# Patient Record
Sex: Female | Born: 1960
Health system: Southern US, Community
[De-identification: ages and names within clinical notes are randomized; demographics above are authoritative.]

## PROBLEM LIST (undated history)

## (undated) DIAGNOSIS — F419 Anxiety disorder, unspecified: Secondary | ICD-10-CM

## (undated) DIAGNOSIS — J45909 Unspecified asthma, uncomplicated: Secondary | ICD-10-CM

## (undated) DIAGNOSIS — J309 Allergic rhinitis, unspecified: Secondary | ICD-10-CM

## (undated) DIAGNOSIS — K589 Irritable bowel syndrome without diarrhea: Secondary | ICD-10-CM

## (undated) DIAGNOSIS — C801 Malignant (primary) neoplasm, unspecified: Secondary | ICD-10-CM

## (undated) DIAGNOSIS — I10 Essential (primary) hypertension: Secondary | ICD-10-CM

## (undated) DIAGNOSIS — T7840XA Allergy, unspecified, initial encounter: Secondary | ICD-10-CM

## (undated) DIAGNOSIS — E785 Hyperlipidemia, unspecified: Secondary | ICD-10-CM

## (undated) DIAGNOSIS — K5289 Other specified noninfective gastroenteritis and colitis: Secondary | ICD-10-CM

## (undated) DIAGNOSIS — M1990 Unspecified osteoarthritis, unspecified site: Secondary | ICD-10-CM

## (undated) HISTORY — PX: BREAST REDUCTION SURGERY: SHX8

## (undated) HISTORY — DX: Allergic rhinitis, unspecified: J30.9

## (undated) HISTORY — DX: Unspecified asthma, uncomplicated: J45.909

## (undated) HISTORY — PX: OTHER SURGICAL HISTORY: SHX169

## (undated) HISTORY — DX: Malignant (primary) neoplasm, unspecified: C80.1

## (undated) HISTORY — PX: WISDOM TOOTH EXTRACTION: SHX21

## (undated) HISTORY — DX: Allergy, unspecified, initial encounter: T78.40XA

## (undated) HISTORY — DX: Hyperlipidemia, unspecified: E78.5

## (undated) HISTORY — DX: Irritable bowel syndrome, unspecified: K58.9

## (undated) HISTORY — DX: Unspecified osteoarthritis, unspecified site: M19.90

## (undated) HISTORY — DX: Essential (primary) hypertension: I10

## (undated) HISTORY — PX: COLONOSCOPY: SHX174

## (undated) HISTORY — DX: Other specified noninfective gastroenteritis and colitis: K52.89

## (undated) HISTORY — DX: Anxiety disorder, unspecified: F41.9

## (undated) HISTORY — PX: KNEE ARTHROSCOPY: SHX127

---

## 2000-04-15 ENCOUNTER — Other Ambulatory Visit: Admission: RE | Admit: 2000-04-15 | Discharge: 2000-04-15 | Payer: Self-pay | Admitting: Obstetrics & Gynecology

## 2001-02-14 ENCOUNTER — Encounter: Payer: Self-pay | Admitting: Obstetrics and Gynecology

## 2001-02-14 ENCOUNTER — Ambulatory Visit (HOSPITAL_COMMUNITY): Admission: RE | Admit: 2001-02-14 | Discharge: 2001-02-14 | Payer: Self-pay | Admitting: Obstetrics and Gynecology

## 2001-03-11 ENCOUNTER — Other Ambulatory Visit: Admission: RE | Admit: 2001-03-11 | Discharge: 2001-03-11 | Payer: Self-pay | Admitting: Obstetrics and Gynecology

## 2001-03-11 ENCOUNTER — Encounter (INDEPENDENT_AMBULATORY_CARE_PROVIDER_SITE_OTHER): Payer: Self-pay | Admitting: *Deleted

## 2001-05-02 ENCOUNTER — Other Ambulatory Visit: Admission: RE | Admit: 2001-05-02 | Discharge: 2001-05-02 | Payer: Self-pay | Admitting: Obstetrics and Gynecology

## 2001-12-08 ENCOUNTER — Encounter: Payer: Self-pay | Admitting: Obstetrics and Gynecology

## 2001-12-08 ENCOUNTER — Ambulatory Visit (HOSPITAL_COMMUNITY): Admission: RE | Admit: 2001-12-08 | Discharge: 2001-12-08 | Payer: Self-pay | Admitting: Obstetrics and Gynecology

## 2002-05-04 ENCOUNTER — Other Ambulatory Visit: Admission: RE | Admit: 2002-05-04 | Discharge: 2002-05-04 | Payer: Self-pay | Admitting: Obstetrics and Gynecology

## 2002-09-14 HISTORY — PX: BUNIONECTOMY: SHX129

## 2002-09-14 HISTORY — PX: TOTAL ABDOMINAL HYSTERECTOMY: SHX209

## 2003-01-01 ENCOUNTER — Encounter (INDEPENDENT_AMBULATORY_CARE_PROVIDER_SITE_OTHER): Payer: Self-pay | Admitting: Specialist

## 2003-01-01 ENCOUNTER — Observation Stay (HOSPITAL_COMMUNITY): Admission: RE | Admit: 2003-01-01 | Discharge: 2003-01-02 | Payer: Self-pay | Admitting: Obstetrics and Gynecology

## 2003-05-04 ENCOUNTER — Ambulatory Visit (HOSPITAL_COMMUNITY): Admission: RE | Admit: 2003-05-04 | Discharge: 2003-05-04 | Payer: Self-pay | Admitting: Obstetrics and Gynecology

## 2003-05-04 ENCOUNTER — Encounter: Payer: Self-pay | Admitting: Obstetrics and Gynecology

## 2003-06-18 ENCOUNTER — Other Ambulatory Visit: Admission: RE | Admit: 2003-06-18 | Discharge: 2003-06-18 | Payer: Self-pay | Admitting: Obstetrics and Gynecology

## 2004-08-26 ENCOUNTER — Other Ambulatory Visit: Admission: RE | Admit: 2004-08-26 | Discharge: 2004-08-26 | Payer: Self-pay | Admitting: Obstetrics and Gynecology

## 2005-07-31 ENCOUNTER — Ambulatory Visit: Payer: Self-pay | Admitting: Internal Medicine

## 2005-09-02 ENCOUNTER — Other Ambulatory Visit: Admission: RE | Admit: 2005-09-02 | Discharge: 2005-09-02 | Payer: Self-pay | Admitting: Obstetrics and Gynecology

## 2006-07-29 ENCOUNTER — Ambulatory Visit: Payer: Self-pay | Admitting: Internal Medicine

## 2006-10-25 ENCOUNTER — Ambulatory Visit: Payer: Self-pay | Admitting: Internal Medicine

## 2007-08-16 ENCOUNTER — Telehealth (INDEPENDENT_AMBULATORY_CARE_PROVIDER_SITE_OTHER): Payer: Self-pay | Admitting: *Deleted

## 2007-08-19 ENCOUNTER — Ambulatory Visit: Payer: Self-pay | Admitting: Internal Medicine

## 2008-04-26 ENCOUNTER — Ambulatory Visit: Payer: Self-pay | Admitting: Internal Medicine

## 2008-04-26 DIAGNOSIS — J45901 Unspecified asthma with (acute) exacerbation: Secondary | ICD-10-CM | POA: Insufficient documentation

## 2008-04-26 DIAGNOSIS — J302 Other seasonal allergic rhinitis: Secondary | ICD-10-CM | POA: Insufficient documentation

## 2008-04-26 DIAGNOSIS — H6690 Otitis media, unspecified, unspecified ear: Secondary | ICD-10-CM | POA: Insufficient documentation

## 2008-04-26 DIAGNOSIS — K5289 Other specified noninfective gastroenteritis and colitis: Secondary | ICD-10-CM | POA: Insufficient documentation

## 2008-04-30 ENCOUNTER — Telehealth (INDEPENDENT_AMBULATORY_CARE_PROVIDER_SITE_OTHER): Payer: Self-pay | Admitting: *Deleted

## 2008-06-21 ENCOUNTER — Ambulatory Visit: Payer: Self-pay | Admitting: Internal Medicine

## 2009-07-23 ENCOUNTER — Ambulatory Visit: Payer: Self-pay | Admitting: Internal Medicine

## 2009-09-23 ENCOUNTER — Telehealth: Payer: Self-pay | Admitting: Internal Medicine

## 2010-10-14 NOTE — Progress Notes (Signed)
Summary: prescript  Phone Note Call from Patient Call back at [phone]   Caller: Patient Call For: Tyshana Nishida Summary of Call: need prescript for generic [facility]  pharmacy [phone] [facility] Initial call taken by: Rickard Patience,  September 23, 2009 8:37 AM  Follow-up for Phone Call        rx sent. pt aware. Carron Curie CMA  September 23, 2009 8:41 AM     Prescriptions: Aleda Grana 50 MCG/ACT  SUSP (FLUTICASONE PROPIONATE) 2 sprays each nostril once daily as needed  #1 x 3   Entered by:   Carron Curie CMA   Authorized by:   Waymon Budge MD   Signed by:   Carron Curie CMA on 09/23/2009   Method used:   Electronically to        [facility] (retail)       [address]       [place], [place]  [address]       Ph: [phone]       Fax: [phone]   RxID:   [id number]

## 2011-01-27 NOTE — Assessment & Plan Note (Signed)
 HEALTHCARE                             PULMONARY OFFICE NOTE   NAME:COLLINS, JENNFER GASSEN                    MRN:          [id number]  DATE:08/19/2007                            DOB:          December 13, 1960    PROBLEMS:  1. Allergic asthma.  2. Allergic rhinitis.  3. History of colitis.   HISTORY:  First visit in almost a year. She had an episode of nasal and  chest congestion with yellow mucus and is now getting better on a  standby prescription for doxycycline that she has started. She feels  that she is getting better. She has had an occasional exposure to a cat.  It is not clear that it is significant. She did get flu shot.   MEDICATIONS:  1. Advair 100/50.  2. Flonase 2 sprays each nostril daily.  3. Paxil 15 mg.  4. Zyrtec 10 mg.  5. Progesterone cream.  6. Pro Air HFA inhaler.  7. Albuterol nebulizer available if needed.   Drug intolerant CODEINE.   OBJECTIVE:  Weight 168 pounds, blood pressure 124/80, pulse 75, room air  saturation 98%. Clear chest. Normal heart sounds. Throat looks clear. I  do not see any nasal congestion. I do not find adenopathy.   IMPRESSION:  Recent URI with bronchitis responding to doxycycline.  Background problems of asthma and allergic rhinitis otherwise have done  well.   PLAN:  1. Finish doxycycline, fluids, and supportive care.  2. We refilled Advair, Pro Air, and Flonase with discussion.  3. Schedule return one year, earlier p.r.n.     Clinton D. Maple Hudson, MD, Tonny Bollman, FACP  Electronically Signed    CDY/MedQ  DD: 08/20/2007  DT: 08/21/2007  Job #: [id number]   cc:   Harrel Lemon. Merla Riches, M.D.

## 2011-01-29 ENCOUNTER — Ambulatory Visit: Payer: Self-pay | Admitting: Internal Medicine

## 2011-01-30 NOTE — Discharge Summary (Signed)
NAME:  [name] [name], [name] [name]                       ACCOUNT NO.:  [id number]   MEDICAL RECORD NO.:  [id number]                   PATIENT TYPE:  OBV   LOCATION:  9115                                 [facility]ILITY:  [facility]   PHYSICIAN:  Janine Limbo, M.D.            DATE OF BIRTH:  [birth year]   DATE OF ADMISSION:  01/01/2003  DATE OF DISCHARGE:  01/02/2003                                 DISCHARGE SUMMARY   DISCHARGE DIAGNOSES:  1. Fibroid uterus.  2. Menorrhagia.  3. Pelvic pressure.   OPERATION:  On the day of admission patient underwent a total vaginal  hysterectomy.  Tolerated procedure well.  The patient was found to have a 14-  week sized uterus with normal appearing tubes and ovaries.   HISTORY OF PRESENT ILLNESS:  The patient is a 50 year old female para 0-0-1-  0 who presents for vaginal hysterectomy because of fibroids, menorrhagia,  and pelvic pressure.  Please see patient's dictated history and physical  examination for details.   PHYSICAL EXAMINATION:  VITAL SIGNS:  Weight 148 pounds.  GENERAL:  Within normal limits.  PELVIC:  External genitalia within normal limits.  Vagina is within normal  limits.  Cervix is nontender.  Uterus is 12 weeks size, irregular, and firm.  Adnexa without masses.  Rectovaginal examination confirms.   HOSPITAL COURSE:  On date of admission patient underwent aforementioned  procedure, tolerating it well.  Postoperative course was unremarkable with  patient resuming bowel and bladder function by postoperative day one and  therefore deemed ready for discharge home.  The patient's postoperative  hemoglobin was 10.7 (preoperative hemoglobin 12.7).   DISCHARGE MEDICATIONS:  1. Percocet one to two tablets q.4-6h. as needed for pain.  2. Ibuprofen 600 mg one tablet q.6h. with food for five days, then p.r.n.     for pain.  3. Iron one tablet b.i.d. for six weeks.  4. Phenergan 12.5 mg one tablet q.6h. as needed for nausea.  5. Colace 100 mg one  tablet b.i.d. until bowel movements are regular.  6. The patient was advised to resume her pre hospital medications with the     exception of Lo-Estrin.   FOLLOW UP:  The patient is scheduled for six week postoperative visit with  Janine Limbo, M.D. on February 13, 2003 at 3:00 p.m.    DISCHARGE INSTRUCTIONS:  The patient was given a copy of [facility] postoperative instruction sheet.  She was further advised to avoid  driving for two weeks, heavy lifting for four weeks, and intercourse for six  weeks.  The patient's diet was without restrictions.  Final pathology was  not available at time of patient's discharge.     [name] [name].                    Janine Limbo, M.D.    EJP/MEDQ  D:  01/02/2003  T:  01/02/2003  Job:  [id number]

## 2011-01-30 NOTE — H&P (Signed)
   NAME:  [name] [name], [name] [name]                       ACCOUNT NO.:  [id number]   MEDICAL RECORD NO.:  [id number]                   PATIENT TYPE:  AMB   LOCATION:  SDC                                  [facility]:  [facility]   PHYSICIAN:  Janine Limbo, M.D.            DATE OF BIRTH:  11-26-1960   DATE OF ADMISSION:  DATE OF DISCHARGE:                                HISTORY & PHYSICAL   HISTORY OF PRESENT ILLNESS:  Ms. [name] [name] is a 50 year old female, Para O,  0/1/0 who presents for a vaginal hysterectomy because of fibroids,  menorrhagia and pelvic pressure. The patient's last PAP smear was within  normal limits. Her endometrial biopsies showed benign element. The patient's  symptoms have not been relieved by oral contraceptive nor pain medications.  Her GYN history is largely uneventful except for a first trimester elective  pregnancy termination.   PAST MEDICAL HISTORY:  The patient has a history of asthma but is doing well  at this time. She has a history of irritable bowel syndrome but again, is  doing well.   MEDICATIONS:  She uses Albuterol, Flovent, and Nasonex as needed. She takes  Zantac for reflux disease.   PAST SURGICAL HISTORY:  The patient has had a breast reduction and also  rhinoplasty in the past.   ALLERGIES:  No known drug allergies.   SOCIAL HISTORY:  The patient drinks alcohol socially. She denies cigarette  use and other recreational drug uses.   REVIEW OF SYSTEMS:  Please see HPI.   FAMILY HISTORY:  The patient's father has a history of heart disease as well  as renal failure. Her mother has glaucoma and has emotional issues.   PHYSICAL EXAMINATION:  VITAL SIGNS: Weight 148 pounds.  HEENT: Within normal limits.  CHEST: Clear.  HEART: Regular rate and rhythm.  BREAST: Without masses.  ABDOMEN: Nontender.  EXTREMITIES: Within normal limits.  NEURO: Grossly normal.  PELVIC: External genitalia within normal limits. Vagina within normal  limits. Cervix  nontender. Uterus is 12 week size, irregular, and firm.  Adnexa no masses. Rectovaginal examination confirms.   ASSESSMENT:  1. 12 week size fibroid uterus.  2. Menorrhagia.  3. Pelvic pressure.    PLAN:  The patient will undergo a vaginal hysterectomy. She understands the  indications for her surgical procedure and she accepts the risks of, but not  limited to, anesthetic complications, bleeding, infections, and possible  damage to surrounding organs. The date for her admission and the date of her  surgery are January 01, 2003.                                                Janine Limbo, M.D.    AVS/MEDQ  D:  12/24/2002  T:  12/24/2002  Job:  [id number]

## 2011-01-30 NOTE — Op Note (Signed)
NAME:  [name] [name], [name] [name]                       ACCOUNT NO.:  [id number]   MEDICAL RECORD NO.:  [id number]                   PATIENT TYPE:  OBV   LOCATION:  9115                                 [facility]ILITY:  [facility]   PHYSICIAN:  Janine Limbo, M.D.            DATE OF BIRTH:  03/30/1961   DATE OF PROCEDURE:  01/01/2003  DATE OF DISCHARGE:                                 OPERATIVE REPORT   PREOPERATIVE DIAGNOSES:  1. A 12-week sized fibroid uterus.  2. Menorrhagia.  3. Pelvic pressure.   POSTOPERATIVE DIAGNOSES:  1. A 14-week sized multifibroid uterus.  2. Menorrhagia.  3. Pelvic pressure.   PROCEDURE:  Total vaginal hysterectomy.   SURGEON:  Janine Limbo, M.D.   FIRST ASSISTANT:  Elmira J. Adline Peals.   ANESTHESIA:  General.   DISPOSITION:  The patient is a 50 year old female para 0-0-1-0 who presents  with the above mentioned diagnoses.  She understands the indications for her  procedure and she accepts the risks of, but not limited to, anesthetic  complications, bleeding, infections, and possible damage to the surrounding  organs.   FINDINGS:  A 247 g multifibroid uterus was removed.  The fallopian tubes and  the ovaries appeared normal.   PROCEDURE:  The patient was taken to the operating room where a general  anesthetic was given.  The patient's lower abdomen, perineum, and vagina  were prepped with multiple layers of Betadine.  A Foley catheter was placed  in the bladder.  The patient was sterilely draped.  Examination under  anesthesia was performed.  The cervix was injected with a diluted solution  of Pitressin and saline.  A circumferential incision was made around the  cervix and the vaginal mucosa was advanced anteriorly and posteriorly.  The  anterior cul-de-sac and the posterior cul-de-sac were sharply entered.  Alternating from right to left the uterosacral ligaments, paracervical  tissues, parametrial tissues, and uterine arteries were clamped,  cut,  sutured, and tied securely.  Attempts were made to invert the uterus through  the posterior colpotomy.  These attempts were unsuccessful because of the  fibroids.  The fibroids were then removed through the posterior colpotomy.  We were then able to completely invert the uterus.  The upper pedicles were  secured.  The upper pedicles were cut and the uterus was removed from the  operative field.  Hemostasis was achieved using figure-of-eight sutures.  The upper pedicles were secured using free ties and then suture ligatures.  The pelvis was inspected and hemostasis was adequate throughout.  The  sutures attached through the uterosacral ligaments were then brought out  through the vaginal angles and tied securely.  A McCall culdoplasty suture  was placed in the posterior cul-de-sac incorporating the uterosacral  ligaments bilaterally and the posterior peritoneum.  A final check was made  for hemostasis and again hemostasis was adequate.  The vaginal cuff was then  closed using figure-of-eight  sutures incorporating the anterior vaginal  mucosa, the anterior peritoneum, the posterior peritoneum, and the posterior  vaginal mucosa.  The McCall culdoplasty suture was tied securely and the  apex of the vagina was noted to elevate into the mid pelvis.  0 Vicryl was  the suture material used throughout the procedure.  Sponge, needle, and  instrument counts were correct on two occasions.  The estimated blood loss  was 200 mL.  The patient tolerated her procedure well.  The patient's  preoperative hemoglobin was 12.7.  She was noted to drain clear yellow urine  at the end of her procedure.  She was taken to the recovery room in stable  condition.                                               Janine Limbo, M.D.    AVS/MEDQ  D:  01/01/2003  T:  01/01/2003  Job:  [id number]

## 2011-01-30 NOTE — Assessment & Plan Note (Signed)
[facility]                               PULMONARY OFFICE NOTE   NAME:[name], [name] HOLTRY                    MRN:          [id number]  DATE:07/29/2006                            DOB:          1961/05/23    PROBLEM:  1. Allergic asthma.  2. Allergic rhinitis.  3. History of colitis.   HISTORY:  One year follow-up.  Weather changes usually cause some nasal  stuffiness and she uses either Astelin or over the counter Chlor-Trimeton.  She does have Flonase used at intervals as well.  Sometimes her eyes itch.  She has not noticed much chest tightness or wheeze.  She did get flu  vaccine.  She had been on allergy vaccine years ago with Dr. Rema Jasmine.  She  continues stressed by the illness of her mother.   MEDICATIONS:  1. Advair 100/50.  2. Claritin.  3. Astelin or over the counter antihistamine.  4. Flonase.  5. Paxil 15 mg daily.  6. Rescue albuterol used rarely.   ALLERGIES:  Drug intolerance for CODEINE.   OBJECTIVE:  VITAL SIGNS:  Weight 155 pounds, blood pressure 122/66, pulse  regular and 90, room air saturation 100%.  HEENT:  Eyes are clear.  There is minimal nasal mucus bridging without  stuffiness or postnasal drainage.  LUNGS:  Lung fields are clear.  HEART:  Heart sounds regular and normal.   IMPRESSION:  Mild intermittent allergic rhinitis and asthma.   PLAN:  I refilled Astelin, her Advair 100/50, and Flonase with medication  talk done.  Schedule return one year, earlier p.r.n.     Clinton D. Maple Hudson, MD, Tonny Bollman, FACP  Electronically Signed    CDY/MedQ  DD: 08/01/2006  DT: 08/02/2006  Job #: [id number]   cc:   [name], M.D.

## 2011-02-16 ENCOUNTER — Ambulatory Visit: Payer: Self-pay | Admitting: Internal Medicine

## 2011-02-17 ENCOUNTER — Other Ambulatory Visit: Payer: Self-pay | Admitting: Internal Medicine

## 2011-02-27 ENCOUNTER — Telehealth: Payer: Self-pay | Admitting: Internal Medicine

## 2011-02-27 MED ORDER — FLUTICASONE-SALMETEROL 100-50 MCG/DOSE IN AEPB: 1.0000 | INHALATION_SPRAY | Freq: Two times a day (BID) | RESPIRATORY_TRACT | Status: DC

## 2011-02-27 NOTE — Telephone Encounter (Signed)
Spoke with pt.  I advised will refill med x 1 only and needs to keep pending appt this month with CDY for future refills. Pt verbalized understanding. Rx sent to pharm.

## 2011-03-09 ENCOUNTER — Encounter: Payer: Self-pay | Admitting: Internal Medicine

## 2011-03-11 ENCOUNTER — Ambulatory Visit: Payer: Self-pay | Admitting: Internal Medicine

## 2011-04-10 ENCOUNTER — Encounter: Payer: Self-pay | Admitting: Internal Medicine

## 2011-04-10 ENCOUNTER — Ambulatory Visit (INDEPENDENT_AMBULATORY_CARE_PROVIDER_SITE_OTHER): Payer: Commercial Managed Care - PPO | Admitting: Internal Medicine

## 2011-04-10 VITALS — BP 110/64 | HR 103 | Ht 66.0 in | Wt 158.0 lb

## 2011-04-10 DIAGNOSIS — J45909 Unspecified asthma, uncomplicated: Secondary | ICD-10-CM

## 2011-04-10 DIAGNOSIS — J309 Allergic rhinitis, unspecified: Secondary | ICD-10-CM

## 2011-04-10 MED ORDER — ALBUTEROL SULFATE HFA 108 (90 BASE) MCG/ACT IN AERS: 2.0000 | INHALATION_SPRAY | RESPIRATORY_TRACT | Status: DC | PRN

## 2011-04-10 MED ORDER — FLUTICASONE-SALMETEROL 100-50 MCG/DOSE IN AEPB: 1.0000 | INHALATION_SPRAY | Freq: Two times a day (BID) | RESPIRATORY_TRACT | Status: DC

## 2011-04-10 MED ORDER — FLUTICASONE PROPIONATE 50 MCG/ACT NA SUSP: 2.0000 | Freq: Every day | NASAL | Status: DC

## 2011-04-10 NOTE — Assessment & Plan Note (Signed)
Good control. We can continue present meds. Discussed generics.

## 2011-04-10 NOTE — Progress Notes (Signed)
Subjective:    Patient ID: [name] [name], female    DOB: [birth year], 50 y.o.   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 Minor cold last winter didn't progress. Continues Advair 100 and uses rescue inhaler 1-2x/ week. Blames summer heat now for a little tightness. Uses flonase in stretches- prolonged use causes mild epistaxis. Zyrtec also sufficient.   Review of Systems Constitutional:   No-   weight loss, night sweats, fevers, chills, fatigue, lassitude. HEENT:   No-   headaches, difficulty swallowing, tooth/dental problems, sore throat,                  No-   sneezing, itching, ear ache, nasal congestion, post nasal drip,   CV:  No-   chest pain, orthopnea, PND, swelling in lower extremities, anasarca, dizziness, palpitations  GI:  No-   heartburn, indigestion, abdominal pain, nausea, vomiting, diarrhea,                 change in bowel habits, loss of appetite  Resp: No-   shortness of breath with exertion or at rest.  No-  excess mucus,             No-   productive cough,  No non-productive cough,  No-  coughing up of blood.              No-   change in color of mucus.  No- wheezing.    Skin: No-   rash or lesions.  GU: No-   dysuria, change in color of urine, no urgency or frequency.  No- flank pain.  MS:  No-   joint pain or swelling.  No- decreased range of motion.  No- back pain.  Psych:  No- change in mood or affect. No depression or anxiety.  No memory loss.      Objective:   Physical Exam General- Alert, Oriented, Affect-appropriate, Distress- none acute   Skin- rash-none, lesions- none, excoriation- none Lymphadenopathy- none Head- atraumatic            Eyes- Gross vision intact, PERRLA, conjunctivae clear secretions            Ears- Hearing, canals normal            Nose- Clear, Mild- Septal dev, narrower  on left,    No- mucus, polyps, erosion, perforation             Throat- Mallampati II , mucosa clear , drainage- none, tonsils- atrophic Neck- flexible , trachea midline, no stridor , thyroid nl, carotid no bruit Chest - symmetrical excursion , unlabored           Heart/CV- RRR , no murmur , no gallop  , no rub, nl s1 s2                           - JVD- none , edema- none, stasis changes- none, varices- none           Lung- clear to P&A, wheeze- none, cough- none , dullness-none, rub- none           Chest wall-  Abd- tender-no, distended-no, bowel sounds-present, HSM- no Br/ Gen/ Rectal- Not done, not indicated Extrem- cyanosis- none, clubbing, none, atrophy- none, strength- nl Neuro- grossly intact to observation  Assessment & Plan:   No problem-specific assessment & plan notes found for this encounter.

## 2011-04-10 NOTE — Patient Instructions (Signed)
Refilled meds - Please call as needed

## 2011-04-10 NOTE — Assessment & Plan Note (Signed)
A little narrower on right with some mucus bridging but no polyps. Continue Flonase and Zyrtec

## 2011-12-02 ENCOUNTER — Telehealth: Payer: Self-pay | Admitting: Pulmonary Disease

## 2011-12-02 MED ORDER — PREDNISONE 10 MG PO TABS: ORAL_TABLET | ORAL | Status: DC

## 2011-12-02 NOTE — Telephone Encounter (Signed)
Spoke with patient-aware of Rx sent.

## 2011-12-02 NOTE — Telephone Encounter (Signed)
Pt sees [name] [name].  Pt would like prednisone called into the pharmacy.  Pt would like to know if [name] [name] will be doing this or if she needs to get an appt w/ someone else today.  Please advise asap.  [name]

## 2011-12-02 NOTE — Telephone Encounter (Signed)
Per CY-okay to give Prednisone 10mg #20 take 4 x 2 days, 3 x 2 days, 2 x 2 days, 1 x 2 days, then stop no refills.  

## 2011-12-02 NOTE — Telephone Encounter (Signed)
I spoke with pt and she c/o runny nose, sneezing, some increase SOB, cough w/ clear phlem, feels achy, nasal congestion, wheezing, chest tightness (using rescue inhaler 4 times in the last 12 hours) x 2 days. Denies any fever, chills, sweats. Pt has tried taking OTC antihistamines w/o reflief. Pt is requesting to have prednisone called in for her. Please advise Dr. Maple Hudson thanks  Allergies  Allergen Reactions  . Codeine     REACTION: nausea     [facility]

## 2012-01-15 ENCOUNTER — Ambulatory Visit (INDEPENDENT_AMBULATORY_CARE_PROVIDER_SITE_OTHER): Payer: Commercial Managed Care - PPO | Admitting: Obstetrics and Gynecology

## 2012-01-15 ENCOUNTER — Encounter: Payer: Self-pay | Admitting: Obstetrics and Gynecology

## 2012-01-15 ENCOUNTER — Telehealth: Payer: Self-pay | Admitting: Obstetrics and Gynecology

## 2012-01-15 VITALS — BP 110/80 | Resp 16 | Ht 66.0 in | Wt 159.0 lb

## 2012-01-15 DIAGNOSIS — Z01419 Encounter for gynecological examination (general) (routine) without abnormal findings: Secondary | ICD-10-CM

## 2012-01-15 DIAGNOSIS — L293 Anogenital pruritus, unspecified: Secondary | ICD-10-CM

## 2012-01-15 DIAGNOSIS — Z90710 Acquired absence of both cervix and uterus: Secondary | ICD-10-CM

## 2012-01-15 DIAGNOSIS — Z8742 Personal history of other diseases of the female genital tract: Secondary | ICD-10-CM

## 2012-01-15 DIAGNOSIS — Z87898 Personal history of other specified conditions: Secondary | ICD-10-CM

## 2012-01-15 DIAGNOSIS — N951 Menopausal and female climacteric states: Secondary | ICD-10-CM

## 2012-01-15 DIAGNOSIS — L292 Pruritus vulvae: Secondary | ICD-10-CM

## 2012-01-15 LAB — POCT WET PREP (WET MOUNT)

## 2012-01-15 MED ORDER — ESTRADIOL 0.05 MG/24HR TD PTTW: 1.0000 | MEDICATED_PATCH | TRANSDERMAL | Status: DC

## 2012-01-15 NOTE — Progress Notes (Signed)
Subjective:    [name] [name] is a 51 y.o. female No obstetric history on file. who presents for annual exam.  The patient has mild vaginal itching, no discharge.  Needs refill on Vivelle 0.05 mg patch (likes brand name).  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  Living with partner of 3 years, relationship good.  The following portions of the patient's history were reviewed and updated as appropriate: allergies, current medications, past family history, past medical history, past social history, past surgical history and problem list.  Review of Systems A comprehensive review of systems was negative. Gastrointestinal:No change in bowel habits, no abdominal pain, no rectal bleeding Genitourinary:negative for dysuria, frequency, hematuria, nocturia and urinary incontinence    Objective:     BP 110/80  Resp 16  Ht 5\' 6"  (1.676 m)  Wt 159 lb (72.122 kg)  BMI 25.66 kg/m2  Weight:  Wt Readings from Last 1 Encounters:  01/15/12 159 lb (72.122 kg)     BMI: Body mass index is 25.66 kg/(m^2). General Appearance: Alert, appropriate appearance for age. No acute distress HEENT: Grossly normal Neck / Thyroid: Supple, no masses, nodes or enlargement Lungs: clear to auscultation bilaterally Back: No CVA tenderness Breast Exam: No dimpling, nipple retraction or discharge. No masses or nodes. and No masses or nodes.No dimpling, nipple retraction or discharge. Cardiovascular: Regular rate and rhythm. S1, S2, no murmur Gastrointestinal: Soft, non-tender, no masses or organomegaly Pelvic Exam: Vulva and vagina appear normal. Bimanual exam reveals normal uterus and adnexa. Rectovaginal: normal rectal, no masses Lymphatic Exam: Non-palpable nodes in neck, clavicular, axillary, or inguinal regions Skin: no rash or abnormalities Neurologic: Normal gait and speech, no tremor  Psychiatric: Alert and oriented, appropriate affect.    Urinalysis:Not done Wet prep negative     Assessment:    Normal gyn exam  Menopausal female--s/p hysterectomy 2004 for fibroids   Plan:    Rx Vivelle 0.05 mg patch (info given on Minivelle patch--will follow-up with me if wants to switch)   Follow-up:  for annual exam Mammogram scheduled next week. Plans colonoscopy this summer Comfort measures for vaginal itching reviewed.

## 2012-01-15 NOTE — Telephone Encounter (Signed)
Spoke with pt regarding Vivelle Dot. Pt would rather rx go to [facility] than [facility] Rx called to [facility] per pt request & the rx that went to [facility] has been canceled. Pt agrees and voices understanding.

## 2012-01-15 NOTE — Telephone Encounter (Signed)
Routed to tyvonna 

## 2012-01-15 NOTE — Progress Notes (Signed)
The patient reports:no complaints  Contraception: None Hysterectomy  Last mammogram: NL per pt April 2013 Last pap: NL 01/2012 GC/Chlamydia cultures offered: declined HIV/RPR/HbsAg offered:  declined HSV 1 and 2 glycoprotein offered: declined  Menstrual cycle regular and monthly: No Hysterectomy Menstrual flow normal: No Hysterectomy  Urinary symptoms: none Normal bowel movements: Yes Reports abuse at home: No:

## 2012-01-19 ENCOUNTER — Other Ambulatory Visit: Payer: Self-pay | Admitting: Obstetrics and Gynecology

## 2012-01-19 NOTE — Telephone Encounter (Signed)
Triage received 

## 2012-01-19 NOTE — Telephone Encounter (Signed)
VL PT

## 2012-01-20 LAB — PAP IG W/ RFLX HPV ASCU

## 2012-01-21 NOTE — Telephone Encounter (Signed)
[name] [name] addressed call yest

## 2012-01-22 NOTE — Telephone Encounter (Signed)
Spoke will pt wants to switch to the Minivelle patch. Informed pt we will inform VL & call her back. Pt agrees & voices understanding.

## 2012-01-26 NOTE — Telephone Encounter (Signed)
Spoke with pt regarding switching to the Minivelle 0.05 patch. Minivelle 0.05 patch 2x/1wk dispense once every 3 months 75yr rf called to [facility] okay per VL. Pt aware & agrees.

## 2012-02-16 ENCOUNTER — Encounter: Payer: Self-pay | Admitting: Obstetrics and Gynecology

## 2012-03-16 ENCOUNTER — Telehealth: Payer: Self-pay | Admitting: Obstetrics and Gynecology

## 2012-03-16 NOTE — Telephone Encounter (Signed)
TC to pt.  Regarding Rf request for Terconazole.  LM to return call.

## 2012-07-12 ENCOUNTER — Ambulatory Visit (INDEPENDENT_AMBULATORY_CARE_PROVIDER_SITE_OTHER): Payer: 59 | Admitting: Internal Medicine

## 2012-07-12 ENCOUNTER — Encounter: Payer: Self-pay | Admitting: Internal Medicine

## 2012-07-12 VITALS — BP 120/90 | HR 66 | Ht 66.0 in | Wt 155.2 lb

## 2012-07-12 DIAGNOSIS — J309 Allergic rhinitis, unspecified: Secondary | ICD-10-CM

## 2012-07-12 DIAGNOSIS — J45909 Unspecified asthma, uncomplicated: Secondary | ICD-10-CM

## 2012-07-12 MED ORDER — ALBUTEROL SULFATE HFA 108 (90 BASE) MCG/ACT IN AERS: 2.0000 | INHALATION_SPRAY | RESPIRATORY_TRACT | Status: DC | PRN

## 2012-07-12 NOTE — Progress Notes (Signed)
  Subjective:    Patient ID: [name] [name], female    DOB: 08-02-1961, 51 y.o.   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 Minor cold last winter didn't progress. Continues Advair 100 and uses rescue inhaler 1-2x/ week. Blames summer heat now for a little tightness. Uses flonase in stretches- prolonged use causes mild epistaxis. Zyrtec also sufficient.   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 Took a prednisone taper for respiratory infection with sinusitis this summer. Doing well since then with little wheeze  ROS-see HPI Constitutional:   No-   weight loss, night sweats, fevers, chills, fatigue, lassitude. HEENT:   No-  headaches, difficulty swallowing, tooth/dental problems, sore throat,       No-  sneezing, itching, ear ache, nasal congestion, post nasal drip,  CV:  No-   chest pain, orthopnea, PND, swelling in lower extremities, anasarca, dizziness, palpitations Resp: No-   shortness of breath with exertion or at rest.              No-   productive cough,  No non-productive cough,  No- coughing up of blood.              No-   change in color of mucus.  No- wheezing.   Skin: No-   rash or lesions. GI:  No-   heartburn, indigestion, abdominal pain, nausea, vomiting, GU: . MS:  No-   joint pain or swelling.   Neuro-     nothing unusual Psych:  No- change in mood or affect. No depression or anxiety.  No memory loss.   Objective:   Physical Exam General- Alert, Oriented, Affect-appropriate, Distress- none acute   Skin- rash-none, lesions- none, excoriation- none Lymphadenopathy- none Head- atraumatic            Eyes- Gross vision intact, PERRLA, conjunctivae clear secretions            Ears-  Hearing, canals normal            Nose- Clear, Mild- Septal dev, narrower on left,    No- mucus, polyps, erosion, perforation             Throat- Mallampati II , mucosa clear , drainage- none, tonsils- atrophic Neck- flexible , trachea midline, no stridor , thyroid nl, carotid no bruit Chest - symmetrical excursion , unlabored           Heart/CV- RRR , no murmur , no gallop  , no rub, nl s1 s2                           - JVD- none , edema- none, stasis changes- none, varices- none           Lung- clear to P&A, wheeze- none, cough- none , dullness-none, rub- none           Chest wall-  Abd-  Br/ Gen/ Rectal- Not done, not indicated Extrem- cyanosis- none, clubbing, none, atrophy- none, strength- nl Neuro- grossly intact to observation   Assessment & Plan:

## 2012-07-12 NOTE — Patient Instructions (Addendum)
Script for [facility] for albuterol rescue inhaler

## 2012-07-24 NOTE — Assessment & Plan Note (Signed)
Intermittent rhinitis generally controlled with antihistamines

## 2012-07-24 NOTE — Assessment & Plan Note (Signed)
We reviewed medications. Advair 100 has been stabilizing this year with rare need for rescue inhaler. Plan-refill rescue inhaler

## 2012-08-02 ENCOUNTER — Other Ambulatory Visit: Payer: Self-pay | Admitting: Internal Medicine

## 2012-08-02 ENCOUNTER — Encounter: Payer: Self-pay | Admitting: Internal Medicine

## 2012-08-02 MED ORDER — BECLOMETHASONE DIPROPIONATE 40 MCG/ACT IN AERS: 2.0000 | INHALATION_SPRAY | Freq: Two times a day (BID) | RESPIRATORY_TRACT | Status: DC

## 2012-08-02 MED ORDER — ALBUTEROL SULFATE HFA 108 (90 BASE) MCG/ACT IN AERS: 2.0000 | INHALATION_SPRAY | Freq: Four times a day (QID) | RESPIRATORY_TRACT | Status: DC | PRN

## 2012-08-04 ENCOUNTER — Encounter: Payer: Self-pay | Admitting: Internal Medicine

## 2012-08-04 MED ORDER — ALBUTEROL SULFATE HFA 108 (90 BASE) MCG/ACT IN AERS: 2.0000 | INHALATION_SPRAY | Freq: Four times a day (QID) | RESPIRATORY_TRACT | Status: DC | PRN

## 2012-08-05 ENCOUNTER — Encounter: Payer: Self-pay | Admitting: Internal Medicine

## 2012-09-12 ENCOUNTER — Telehealth: Payer: Self-pay | Admitting: Internal Medicine

## 2012-09-12 MED ORDER — PREDNISONE 10 MG PO TABS: ORAL_TABLET | ORAL | Status: DC

## 2012-09-12 NOTE — Telephone Encounter (Signed)
Per CY-okay to give Prednisone 10 mg #20 take 4 x 2 days, 3 x 2 days, 2 x 2 days,1 x 2 days, then stop. No refills.

## 2012-09-12 NOTE — Telephone Encounter (Signed)
Spoke with patient-states she gets like this every year-congested sinus', runny nose, watery eyes; denies any fever or chills and was hoping CY would call in Prednisone Rx to [facility] to help with this. CY please advise. Thanks.

## 2012-09-12 NOTE — Telephone Encounter (Signed)
Rx was sent to pharm  Pt aware  

## 2013-06-12 ENCOUNTER — Encounter: Payer: Self-pay | Admitting: Internal Medicine

## 2013-07-20 ENCOUNTER — Other Ambulatory Visit: Payer: Self-pay

## 2013-08-01 ENCOUNTER — Ambulatory Visit (AMBULATORY_SURGERY_CENTER): Payer: Self-pay

## 2013-08-01 VITALS — Ht 66.0 in | Wt 155.2 lb

## 2013-08-01 DIAGNOSIS — Z800 Family history of malignant neoplasm of digestive organs: Secondary | ICD-10-CM

## 2013-08-01 MED ORDER — MOVIPREP 100 G PO SOLR: ORAL | Status: DC

## 2013-08-03 ENCOUNTER — Encounter: Payer: Self-pay | Admitting: Internal Medicine

## 2013-08-21 ENCOUNTER — Telehealth: Payer: Self-pay

## 2013-08-21 NOTE — Telephone Encounter (Deleted)
[facility] Vanderbilt Assessment Scale, Teacher Informant Completed by: Arnoldo Lenis  EC self-contained  [id number]   Date Completed: 08/16/2013  Results Total number of questions score 2 or 3 in questions #1-9 (Inattention):  6 Total number of questions score 2 or 3 in questions #10-18 (Hyperactive/Impulsive): 0 Total Symptom Score:  6 Total number of questions scored 2 or 3 in questions #19-28 (Oppositional/Conduct):   0 Total number of questions scored 2 or 3 in questions #29-31 (Anxiety Symptoms):  0 Total number of questions scored 2 or 3 in questions #32-35 (Depressive Symptoms): 0  Academics (1 is excellent, 2 is above average, 3 is average, 4 is somewhat of a problem, 5 is problematic) Reading: 5 Mathematics:  5 Written Expression: 5  Classroom Behavioral Performance (1 is excellent, 2 is above average, 3 is average, 4 is somewhat of a problem, 5 is problematic) Relationship with peers:  3 Following directions:  4 Disrupting class:  2 Assignment completion:  4 Organizational skills:  4

## 2013-08-23 NOTE — Telephone Encounter (Signed)
Entry made in error

## 2013-08-25 ENCOUNTER — Ambulatory Visit (AMBULATORY_SURGERY_CENTER): Payer: 59 | Admitting: Internal Medicine

## 2013-08-25 ENCOUNTER — Encounter: Payer: Self-pay | Admitting: Internal Medicine

## 2013-08-25 VITALS — BP 141/87 | HR 78 | Temp 96.8°F | Resp 18 | Ht 66.0 in | Wt 155.0 lb

## 2013-08-25 DIAGNOSIS — Z800 Family history of malignant neoplasm of digestive organs: Secondary | ICD-10-CM

## 2013-08-25 DIAGNOSIS — Z1211 Encounter for screening for malignant neoplasm of colon: Secondary | ICD-10-CM

## 2013-08-25 DIAGNOSIS — D126 Benign neoplasm of colon, unspecified: Secondary | ICD-10-CM

## 2013-08-25 MED ORDER — SODIUM CHLORIDE 0.9 % IV SOLN: 500.0000 mL | INTRAVENOUS | Status: DC

## 2013-08-25 NOTE — Progress Notes (Signed)
Report to pacu rn, vss, bbs=clear 

## 2013-08-25 NOTE — Patient Instructions (Signed)
Impressions/recommendations:  Polyps (handout given)  Hold aspirin, aspirin products, and anti-inflammatory medications for 1 week. May resum 12/20. Repeat colonoscopy pending pathology results.  YOU HAD AN ENDOSCOPIC PROCEDURE TODAY AT THE [facility]: Refer to the procedure report that was given to you for any specific questions about what was found during the examination.  If the procedure report does not answer your questions, please call your gastroenterologist to clarify.  If you requested that your care partner not be given the details of your procedure findings, then the procedure report has been included in a sealed envelope for you to review at your convenience later.  YOU SHOULD EXPECT: Some feelings of bloating in the abdomen. Passage of more gas than usual.  Walking can help get rid of the air that was put into your GI tract during the procedure and reduce the bloating. If you had a lower endoscopy (such as a colonoscopy or flexible sigmoidoscopy) you may notice spotting of blood in your stool or on the toilet paper. If you underwent a bowel prep for your procedure, then you may not have a normal bowel movement for a few days.  DIET: Your first meal following the procedure should be a light meal and then it is ok to progress to your normal diet.  A half-sandwich or bowl of soup is an example of a good first meal.  Heavy or fried foods are harder to digest and may make you feel nauseous or bloated.  Likewise meals heavy in dairy and vegetables can cause extra gas to form and this can also increase the bloating.  Drink plenty of fluids but you should avoid alcoholic beverages for 24 hours.  ACTIVITY: Your care partner should take you home directly after the procedure.  You should plan to take it easy, moving slowly for the rest of the day.  You can resume normal activity the day after the procedure however you should NOT DRIVE or use heavy machinery for 24 hours (because of the  sedation medicines used during the test).    SYMPTOMS TO REPORT IMMEDIATELY: A gastroenterologist can be reached at any hour.  During normal business hours, 8:30 AM to 5:00 PM Monday through Friday, call [phone].  After hours and on weekends, please call the GI answering service at [phone] who will take a message and have the physician on call contact you.   Following lower endoscopy (colonoscopy or flexible sigmoidoscopy):  Excessive amounts of blood in the stool  Significant tenderness or worsening of abdominal pains  Swelling of the abdomen that is new, acute  Fever of 100F or higher  FOLLOW UP: If any biopsies were taken you will be contacted by phone or by letter within the next 1-3 weeks.  Call your gastroenterologist if you have not heard about the biopsies in 3 weeks.  Our staff will call the home number listed on your records the next business day following your procedure to check on you and address any questions or concerns that you may have at that time regarding the information given to you following your procedure. This is a courtesy call and so if there is no answer at the home number and we have not heard from you through the emergency physician on call, we will assume that you have returned to your regular daily activities without incident.  SIGNATURES/CONFIDENTIALITY: You and/or your care partner have signed paperwork which will be entered into your electronic medical record.  These signatures attest to the  fact that that the information above on your After Visit Summary has been reviewed and is understood.  Full responsibility of the confidentiality of this discharge information lies with you and/or your care-partner.

## 2013-08-25 NOTE — Op Note (Signed)
[facility] [address] [place], [address]   COLONOSCOPY PROCEDURE REPORT  PATIENT: [name] [name], [name] [name]  MR#: [id number] BIRTHDATE: 1961-07-07 , 52  yrs. old GENDER: Female ENDOSCOPIST: Beverley Fiedler, MD REFERRED QQ:VZDGL Bouska, M.D. PROCEDURE DATE:  08/25/2013 PROCEDURE:   Colonoscopy with snare polypectomy First Screening Colonoscopy - Avg.  risk and is 50 yrs.  old or older Yes.  Prior Negative Screening - Now for repeat screening. N/A  History of Adenoma - Now for follow-up colonoscopy & has been > or = to 3 yrs.  N/A  Polyps Removed Today? Yes. ASA CLASS:   Class II INDICATIONS:average risk screening and first colonoscopy. MEDICATIONS: MAC sedation, administered by CRNA and propofol (Diprivan) 500mg  IV  DESCRIPTION OF PROCEDURE:   After the risks benefits and alternatives of the procedure were thoroughly explained, informed consent was obtained.  A digital rectal exam revealed no rectal mass.   The LB [id number]  endoscope was introduced through the anus and advanced to the cecum, which was identified by both the appendix and ileocecal valve. No adverse events experienced. The quality of the prep was good, using MoviPrep  The instrument was then slowly withdrawn as the colon was fully examined.    COLON FINDINGS: Three sessile polyps measuring 5-6 mm in size were found in the transverse colon (2) and distal sigmoid colon. Polypectomy was performed using cold snare.  All resections were complete and all polyp tissue was completely retrieved. Retroflexed views revealed no abnormalities. The time to cecum=6 minutes 16 seconds.  Withdrawal time=9 minutes 01 seconds.  The scope was withdrawn and the procedure completed. COMPLICATIONS: There were no complications.  ENDOSCOPIC IMPRESSION: Three sessile polyps measuring 5-6 mm in size were found in the transverse colon and distal sigmoid colon; Polypectomy was performed using cold  snare  RECOMMENDATIONS: 1.  Hold aspirin, aspirin products, and anti-inflammatory medication for 1 week. 2.  Await pathology results 3.  If the polyps removed today are proven to be adenomatous (pre-cancerous) polyps, you will need a colonoscopy in 3 years. Otherwise you should continue to follow colorectal cancer screening guidelines for "routine risk" patients with a colonoscopy in 10 years.  You will receive a letter within 1-2 weeks with the results of your biopsy as well as final recommendations.  Please call my office if you have not received a letter after 3 weeks.   eSigned:  Beverley Fiedler, MD 08/25/2013 10:54 AM     cc: The Patient and Tracey Harries, MD

## 2013-08-25 NOTE — Progress Notes (Signed)
Patient did not experience any of the following events: a burn prior to discharge; a fall within the facility; wrong site/side/patient/procedure/implant event; or a hospital transfer or hospital admission upon discharge from the facility. (G8907) Patient did not have preoperative order for IV antibiotic SSI prophylaxis. (G8918)  

## 2013-08-25 NOTE — Progress Notes (Signed)
Called to room to assist during endoscopic procedure.  Patient ID and intended procedure confirmed with present staff. Received instructions for my participation in the procedure from the performing physician.  

## 2013-08-28 ENCOUNTER — Telehealth: Payer: Self-pay | Admitting: *Deleted

## 2013-08-28 NOTE — Telephone Encounter (Signed)
  Follow up Call-  Call back number 08/25/2013  Post procedure Call Back phone  # [phone]  Permission to leave phone message Yes     Patient questions:  Do you have a fever, pain , or abdominal swelling? no Pain Score  0 *  Have you tolerated food without any problems? yes  Have you been able to return to your normal activities? yes  Do you have any questions about your discharge instructions: Diet   no Medications  no Follow up visit  no  Do you have questions or concerns about your Care? no  Actions: * If pain score is 4 or above: No action needed, pain <4.

## 2013-08-29 ENCOUNTER — Encounter: Payer: Self-pay | Admitting: Internal Medicine

## 2013-08-29 ENCOUNTER — Ambulatory Visit (INDEPENDENT_AMBULATORY_CARE_PROVIDER_SITE_OTHER): Payer: 59 | Admitting: Internal Medicine

## 2013-08-29 VITALS — BP 122/72 | HR 63 | Ht 66.0 in | Wt 156.0 lb

## 2013-08-29 DIAGNOSIS — J309 Allergic rhinitis, unspecified: Secondary | ICD-10-CM

## 2013-08-29 DIAGNOSIS — J45909 Unspecified asthma, uncomplicated: Secondary | ICD-10-CM

## 2013-08-29 MED ORDER — BECLOMETHASONE DIPROPIONATE 40 MCG/ACT IN AERS: 2.0000 | INHALATION_SPRAY | Freq: Two times a day (BID) | RESPIRATORY_TRACT | Status: DC

## 2013-08-29 MED ORDER — ALBUTEROL SULFATE HFA 108 (90 BASE) MCG/ACT IN AERS: 2.0000 | INHALATION_SPRAY | Freq: Four times a day (QID) | RESPIRATORY_TRACT | Status: DC | PRN

## 2013-08-29 NOTE — Progress Notes (Signed)
Subjective:    Patient ID: [name] [name], female    DOB: [birth year], 52 y.o.   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 Minor cold last winter didn't progress. Continues Advair 100 and uses rescue inhaler 1-2x/ week. Blames summer heat now for a little tightness. Uses flonase in stretches- prolonged use causes mild epistaxis. Zyrtec also sufficient.   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 Took a prednisone taper for respiratory infection with sinusitis this summer. Doing well since then with little wheeze  12/161/4- 52 yoF former minimal smoker, Charity fundraiser for [employer],  followed for allergic rhinitis, hx otitis, asthma Followed for asthma. Pt states she had some wheezing when the leaves began to fall but was able to control it with medications.  Rare need for rescue inhaler, usually with weather change. Seasonal Flonase. Blood pressure control was hard and she wants to stay on lisinopril.  ROS-see HPI Constitutional:   No-   weight loss, night sweats, fevers, chills, fatigue, lassitude. HEENT:   No-  headaches, difficulty swallowing, tooth/dental problems, sore throat,       No-  sneezing, itching, ear ache, nasal congestion, post nasal drip,  CV:  No-   chest pain, orthopnea, PND, swelling in lower extremities, anasarca, dizziness, palpitations Resp: No-   shortness of breath with exertion or at rest.              No-   productive cough,  No non-productive cough,  No- coughing up of blood.              No-   change in color of mucus.  No- wheezing.   Skin: No-   rash or lesions. GI:  No-   heartburn, indigestion, abdominal pain, nausea, vomiting, GU: . MS:  No-   joint pain or swelling.    Neuro-     nothing unusual Psych:  No- change in mood or affect. No depression or anxiety.  No memory loss.   Objective:   Physical Exam General- Alert, Oriented, Affect-appropriate, Distress- none acute   Skin- rash-none, lesions- none, excoriation- none Lymphadenopathy- none Head- atraumatic            Eyes- Gross vision intact, PERRLA, conjunctivae clear secretions            Ears- Hearing, canals normal            Nose- Clear, Mild- Septal dev, narrower on left,    No- mucus, polyps, erosion, perforation             Throat- Mallampati II , mucosa clear , drainage- none, tonsils- atrophic Neck- flexible , trachea midline, no stridor , thyroid nl, carotid no bruit Chest - symmetrical excursion , unlabored           Heart/CV- RRR , no murmur , no gallop  , no rub, nl s1 s2                           - JVD- none , edema- none, stasis changes- none, varices- none           Lung- clear to P&A, wheeze- none, cough- none , dullness-none, rub- none  Chest wall-  Abd-  Br/ Gen/ Rectal- Not done, not indicated Extrem- cyanosis- none, clubbing, none, atrophy- none, strength- nl Neuro- grossly intact to observation   Assessment & Plan:

## 2013-08-29 NOTE — Patient Instructions (Addendum)
Refills to [facility] for Proventil and Qvar 40 were sent  You can check your formulary to see if there is a significant cost advantage Advair vs Symbicort vs Dulera. These are all like Qvar PLUS a bronchodilator. Qvar is just the passive steroid.  Ok to call and come back for Pneumovax 23 strain pneumonia vaccine if you decide.

## 2013-09-18 NOTE — Assessment & Plan Note (Signed)
Mild persistent asthma without complication, controlled Plan-refill Proventil and Qvar 40, pneumonia vaccine

## 2013-09-18 NOTE — Assessment & Plan Note (Signed)
Controlled with Flonase seasonally

## 2013-12-05 ENCOUNTER — Encounter: Payer: Self-pay | Admitting: Internal Medicine

## 2013-12-05 ENCOUNTER — Other Ambulatory Visit: Payer: Self-pay | Admitting: *Deleted

## 2013-12-05 MED ORDER — FLUTICASONE-SALMETEROL 100-50 MCG/DOSE IN AEPB: 1.0000 | INHALATION_SPRAY | Freq: Two times a day (BID) | RESPIRATORY_TRACT | Status: DC

## 2013-12-05 NOTE — Telephone Encounter (Signed)
Dr Annamaria Boots, please advise if ok to switch from qvar back to advair thanks!

## 2013-12-05 NOTE — Telephone Encounter (Signed)
Per CY-okay to go ahead and start back on Advair again-we can send Rx to pharmacy as well.  Looking back on file patient has been on Advair 100/50.

## 2013-12-06 DIAGNOSIS — I10 Essential (primary) hypertension: Secondary | ICD-10-CM | POA: Insufficient documentation

## 2014-03-12 ENCOUNTER — Other Ambulatory Visit: Payer: Self-pay | Admitting: Orthopaedic Surgery

## 2014-03-12 DIAGNOSIS — M25561 Pain in right knee: Secondary | ICD-10-CM

## 2014-03-21 ENCOUNTER — Ambulatory Visit
Admission: RE | Admit: 2014-03-21 | Discharge: 2014-03-21 | Disposition: A | Discharge status: Home / Self Care | Payer: 59 | Source: Ambulatory Visit | Attending: Orthopaedic Surgery | Admitting: Orthopaedic Surgery

## 2014-03-21 DIAGNOSIS — M25561 Pain in right knee: Secondary | ICD-10-CM

## 2014-04-04 ENCOUNTER — Encounter: Payer: Self-pay | Admitting: Internal Medicine

## 2014-04-04 NOTE — Telephone Encounter (Signed)
Dr Annamaria Boots,  Patient is calling requesting that we refill her Flonase. Last filled 04/10/11 Pt states that she is needing something called in for her nasal allergies. At last OV on 08/29/13, it looks like the Flonase was discontinued. Pt states that the Flonase will suffice unless you have another option for her.   [place]  Allergies  Allergen Reactions  . Bee Venom Anaphylaxis  . Amlodipine     Feet swelled  . Benicar [Olmesartan]     diarrhea  . Codeine     REACTION: nausea  . Flagyl [Metronidazole]     Nausea and vomiting    Please advise Dr Annamaria Boots. Thanks.

## 2014-04-05 NOTE — Telephone Encounter (Signed)
Ok to refill Flonase/fluticasone # 1, 12 puffs each nostril once or twice daily, refill prn.  Also let her know this is now otc, so script not required.

## 2014-04-05 NOTE — Telephone Encounter (Signed)
Made pt aware via email Nothing further needed.

## 2014-08-20 DIAGNOSIS — Z0000 Encounter for general adult medical examination without abnormal findings: Secondary | ICD-10-CM | POA: Insufficient documentation

## 2014-08-20 DIAGNOSIS — T63481A Toxic effect of venom of other arthropod, accidental (unintentional), initial encounter: Secondary | ICD-10-CM | POA: Insufficient documentation

## 2014-08-21 ENCOUNTER — Encounter: Payer: Self-pay | Admitting: Internal Medicine

## 2014-08-21 ENCOUNTER — Ambulatory Visit: Payer: 59 | Admitting: Internal Medicine

## 2014-08-21 VITALS — BP 128/74 | HR 72 | Ht 66.0 in | Wt 159.0 lb

## 2014-08-21 DIAGNOSIS — J45909 Unspecified asthma, uncomplicated: Secondary | ICD-10-CM

## 2014-08-21 MED ORDER — PREDNISONE 10 MG PO TABS: ORAL_TABLET | ORAL | Status: DC

## 2014-08-21 NOTE — Patient Instructions (Signed)
Script prednisone sent to hold in case needed for asthma  Ok to use a little Afrin once in a while  Ok to continue present meds and to call for refills as needed  Please call as needed

## 2014-08-21 NOTE — Progress Notes (Signed)
Subjective:    Patient ID: [name] [name], female    DOB: Nov 09, 1960, 53 y.o.   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 Minor cold last winter didn't progress. Continues Advair 100 and uses rescue inhaler 1-2x/ week. Blames summer heat now for a little tightness. Uses flonase in stretches- prolonged use causes mild epistaxis. Zyrtec also sufficient.   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 Took a prednisone taper for respiratory infection with sinusitis this summer. Doing well since then with little wheeze  12/161/4- 53 yoF former minimal smoker, Therapist, sports for [employer],  followed for allergic rhinitis, hx otitis, asthma Followed for asthma. Pt states she had some wheezing when the leaves began to fall but was able to control it with medications.  Rare need for rescue inhaler, usually with weather change. Seasonal Flonase. Blood pressure control was hard and she wants to stay on lisinopril.  08/21/14- 53 yoF former minimal smoker, Therapist, sports for [employer],  followed for allergic rhinitis, hx otitis, asthma follows for: pt c/o sob with exertion, sinus congestion, runny nose, sneezing, sore throat X2 days.  States her asthma is well maintained currently.     ROS-see HPI Constitutional:   No-   weight loss, night sweats, fevers, chills, fatigue, lassitude. HEENT:   No-  headaches, difficulty swallowing, tooth/dental problems, sore throat,       No-  sneezing, itching, ear ache, nasal congestion, post nasal drip,  CV:  No-   chest pain, orthopnea, PND, swelling in lower extremities, anasarca, dizziness, palpitations Resp: No-   shortness of breath with exertion or at rest.              No-    productive cough,  No non-productive cough,  No- coughing up of blood.              No-   change in color of mucus.  No- wheezing.   Skin: No-   rash or lesions. GI:  No-   heartburn, indigestion, abdominal pain, nausea, vomiting, GU: . MS:  No-   joint pain or swelling.   Neuro-     nothing unusual Psych:  No- change in mood or affect. No depression or anxiety.  No memory loss.   Objective:   Physical Exam General- Alert, Oriented, Affect-appropriate, Distress- none acute   Skin- rash-none, lesions- none, excoriation- none Lymphadenopathy- none Head- atraumatic            Eyes- Gross vision intact, PERRLA, conjunctivae clear secretions            Ears- Hearing, canals normal            Nose- Clear, Mild- Septal dev, narrower on left,    No- mucus, polyps, erosion, perforation             Throat- Mallampati II , mucosa clear , drainage- none, tonsils- atrophic Neck- flexible , trachea midline, no stridor , thyroid nl, carotid no bruit Chest - symmetrical excursion , unlabored           Heart/CV- RRR , no murmur , no gallop  , no rub, nl s1 s2                           -  JVD- none , edema- none, stasis changes- none, varices- none           Lung- clear to P&A, wheeze- none, cough- none , dullness-none, rub- none           Chest wall-  Abd-  Br/ Gen/ Rectal- Not done, not indicated Extrem- cyanosis- none, clubbing, none, atrophy- none, strength- nl Neuro- grossly intact to observation   Assessment & Plan:

## 2014-08-22 ENCOUNTER — Encounter: Payer: Self-pay | Admitting: Internal Medicine

## 2014-08-23 ENCOUNTER — Telehealth: Payer: Self-pay | Admitting: Internal Medicine

## 2014-08-23 MED ORDER — AZITHROMYCIN 250 MG PO TABS: ORAL_TABLET | ORAL | Status: DC

## 2014-08-23 MED ORDER — ALBUTEROL SULFATE HFA 108 (90 BASE) MCG/ACT IN AERS: 2.0000 | INHALATION_SPRAY | Freq: Four times a day (QID) | RESPIRATORY_TRACT | Status: DC | PRN

## 2014-08-23 MED ORDER — HYDROCODONE-HOMATROPINE 5-1.5 MG/5ML PO SYRP: 5.0000 mL | ORAL_SOLUTION | Freq: Four times a day (QID) | ORAL | Status: DC | PRN

## 2014-08-23 NOTE — Telephone Encounter (Signed)
Called and spoke to pt. Pt stated since she was seen by CY on 08/21/14 her cough has worsened. Pt c/o dry cough, sore throat and chest soreness from cough and insomnia d/t cough. Pt denies wheezing, CP/tightness, SOB, f/c/s and chest congestion. Pt requesting a stronger cough medication than delsym, which is not helping cough.   Dr Annamaria Boots please advise.   Allergies  Allergen Reactions  . Bee Venom Anaphylaxis  . Amlodipine     Feet swelled  . Benicar [Olmesartan]     diarrhea  . Codeine     REACTION: nausea  . Flagyl [Metronidazole]     Nausea and vomiting  . Lisinopril-Hydrochlorothiazide     itching    Current Outpatient Prescriptions on File Prior to Visit  Medication Sig Dispense Refill  . Calcium Carbonate-Vit D-Min 600-400 MG-UNIT TABS Take 1 tablet by mouth daily.      . cetirizine (ZYRTEC) 10 MG tablet Take 10 mg by mouth daily.      [name] dextromethorphan-guaiFENesin (MUCINEX DM) 30-600 MG per 12 hr tablet Take 1 tablet by mouth 2 (two) times daily as needed.     [name] estradiol (VIVELLE-DOT) 0.05 MG/24HR patch Place 1 patch onto the skin 2 (two) times a week.    . fluticasone (FLONASE) 50 MCG/ACT nasal spray Place into both nostrils daily.    . Fluticasone-Salmeterol (ADVAIR DISKUS) 100-50 MCG/DOSE AEPB Inhale 1 puff into the lungs 2 (two) times daily. 60 each 5  . Misc Natural Products (PROGESTERONE) 1000 MG/60GM CREA Apply topically.      . Multiple Vitamin (MULTIVITAMIN) capsule Take 1 capsule by mouth daily.      . nebivolol (BYSTOLIC) 10 MG tablet Take 10 mg by mouth daily.    . predniSONE (DELTASONE) 10 MG tablet 4 X 2 DAYS, 3 X 2 DAYS, 2 X 2 DAYS, 1 X 2 DAYS 20 tablet 0   No current facility-administered medications on file prior to visit.

## 2014-08-23 NOTE — Telephone Encounter (Signed)
Offer hydromet, since intolerant of codeine: 200 ml, 5 ml every 6 hours if needed for cough If she needs antibiotic then offer Z pak

## 2014-08-23 NOTE — Telephone Encounter (Signed)
Called pt. RX for ocugh syrup printed off and placed on CDY cart. This will be picked up this afternoon. ZPAk sent in as well. Nothing further needed

## 2015-01-03 ENCOUNTER — Other Ambulatory Visit: Payer: Self-pay | Admitting: Internal Medicine

## 2015-05-14 DIAGNOSIS — E781 Pure hyperglyceridemia: Secondary | ICD-10-CM | POA: Insufficient documentation

## 2015-07-11 ENCOUNTER — Other Ambulatory Visit: Payer: Self-pay | Admitting: Internal Medicine

## 2015-08-22 ENCOUNTER — Encounter: Payer: Self-pay | Admitting: Internal Medicine

## 2015-08-22 ENCOUNTER — Ambulatory Visit (INDEPENDENT_AMBULATORY_CARE_PROVIDER_SITE_OTHER): Payer: 59 | Admitting: Internal Medicine

## 2015-08-22 VITALS — BP 130/72 | HR 60 | Ht 66.0 in | Wt 167.2 lb

## 2015-08-22 DIAGNOSIS — J4520 Mild intermittent asthma, uncomplicated: Secondary | ICD-10-CM | POA: Diagnosis not present

## 2015-08-22 DIAGNOSIS — J302 Other seasonal allergic rhinitis: Secondary | ICD-10-CM

## 2015-08-22 MED ORDER — ALBUTEROL SULFATE HFA 108 (90 BASE) MCG/ACT IN AERS: 2.0000 | INHALATION_SPRAY | Freq: Four times a day (QID) | RESPIRATORY_TRACT | Status: DC | PRN

## 2015-08-22 MED ORDER — PREDNISONE 10 MG PO TABS: ORAL_TABLET | ORAL | Status: DC

## 2015-08-22 MED ORDER — FLUTICASONE-SALMETEROL 100-50 MCG/DOSE IN AEPB: INHALATION_SPRAY | RESPIRATORY_TRACT | Status: DC

## 2015-08-22 MED ORDER — CIPROFLOXACIN HCL 500 MG PO TABS: 500.0000 mg | ORAL_TABLET | Freq: Two times a day (BID) | ORAL | Status: DC

## 2015-08-22 NOTE — Patient Instructions (Signed)
Scripts sent refilling albuterol and Advair inhalers  Script sent for prednisoine taper and for Cipro to carry  Please call if we can help

## 2015-08-22 NOTE — Assessment & Plan Note (Signed)
Rare need for rescue inhaler any longer. May be able to reduce Advair to once daily during quiet intervals. Medication talk done.

## 2015-08-22 NOTE — Assessment & Plan Note (Signed)
Recognizes easier to control her she gets older. Antihistamines usually sufficient

## 2015-08-22 NOTE — Progress Notes (Signed)
Subjective:    Patient ID: [name] [name], female    DOB: Dec 26, 1960, 54 y.o.   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 Minor cold last winter didn't progress. Continues Advair 100 and uses rescue inhaler 1-2x/ week. Blames summer heat now for a little tightness. Uses flonase in stretches- prolonged use causes mild epistaxis. Zyrtec also sufficient.   07/12/12- 14 yoF never smoker, Therapist, sports for [employer],  followed for allergic rhinitis, hx otitis, asthma Wheezing during weather change-had to use rescue inhaler few times. Took a prednisone taper for respiratory infection with sinusitis this summer. Doing well since then with little wheeze  12/161/4- 52 yoF former minimal smoker, Therapist, sports for [employer],  followed for allergic rhinitis, hx otitis, asthma Followed for asthma. Pt states she had some wheezing when the leaves began to fall but was able to control it with medications.  Rare need for rescue inhaler, usually with weather change. Seasonal Flonase. Blood pressure control was hard and she wants to stay on lisinopril.  08/21/14- 33 yoF former minimal smoker, Therapist, sports for [employer],  followed for allergic rhinitis, hx otitis, asthma follows for: pt c/o sob with exertion, sinus congestion, runny nose, sneezing, sore throat X2 days.  States her asthma is well maintained currently.    08/22/2015-54 year old female former minimal smoker, Therapist, sports for [employer], followed for allergic rhinitis, history otitis, asthma FOLLOWS FOR: Pt states she has done well overall except for when weather changes-gets slightly congested. Works from home now. Usually only has trouble if she gets a viral respiratory infection. Rare use of rescue inhaler. Continues Advair twice daily. She asks medications to carry with her for upcoming trip to  [place]-antibiotic, prednisone taper.  ROS-see HPI Constitutional:   No-   weight loss, night sweats, fevers, chills, fatigue, lassitude. HEENT:   No-  headaches, difficulty swallowing, tooth/dental problems, sore throat,       No-  sneezing, itching, ear ache, nasal congestion, post nasal drip,  CV:  No-   chest pain, orthopnea, PND, swelling in lower extremities, anasarca, dizziness, palpitations Resp: No-   shortness of breath with exertion or at rest.              No-   productive cough,  No non-productive cough,  No- coughing up of blood.              No-   change in color of mucus.  No- wheezing.   Skin: No-   rash or lesions. GI:  No-   heartburn, indigestion, abdominal pain, nausea, vomiting, GU: . MS:  No-   joint pain or swelling.   Neuro-     nothing unusual Psych:  No- change in mood or affect. No depression or anxiety.  No memory loss.   Objective:   Physical Exam General- Alert, Oriented, Affect-appropriate, Distress- none acute   Skin- rash-none, lesions- none, excoriation- none Lymphadenopathy- none Head- atraumatic            Eyes- Gross vision intact, PERRLA, conjunctivae clear secretions            Ears- Hearing, canals normal            Nose- Clear, Mild- Septal dev, narrower on left,    No- mucus, polyps, erosion, perforation  Throat- Mallampati II , mucosa clear , drainage- none, tonsils- atrophic Neck- flexible , trachea midline, no stridor , thyroid nl, carotid no bruit Chest - symmetrical excursion , unlabored           Heart/CV- RRR , no murmur , no gallop  , no rub, nl s1 s2                           - JVD- none , edema- none, stasis changes- none, varices- none           Lung- clear to P&A, wheeze- none, cough- none , dullness-none, rub- none           Chest wall-  Abd-  Br/ Gen/ Rectal- Not done, not indicated Extrem- cyanosis- none, clubbing, none, atrophy- none, strength- nl Neuro- grossly intact to observation   Assessment & Plan:

## 2016-03-05 DIAGNOSIS — L2084 Intrinsic (allergic) eczema: Secondary | ICD-10-CM | POA: Insufficient documentation

## 2016-05-11 DIAGNOSIS — L989 Disorder of the skin and subcutaneous tissue, unspecified: Secondary | ICD-10-CM | POA: Insufficient documentation

## 2016-06-11 ENCOUNTER — Encounter: Payer: Self-pay | Admitting: *Deleted

## 2016-06-15 ENCOUNTER — Ambulatory Visit (INDEPENDENT_AMBULATORY_CARE_PROVIDER_SITE_OTHER): Payer: 59 | Admitting: Physician Assistant

## 2016-06-15 DIAGNOSIS — M1711 Unilateral primary osteoarthritis, right knee: Secondary | ICD-10-CM

## 2016-06-22 ENCOUNTER — Ambulatory Visit (INDEPENDENT_AMBULATORY_CARE_PROVIDER_SITE_OTHER): Payer: 59 | Admitting: Orthopaedic Surgery

## 2016-06-22 DIAGNOSIS — M1711 Unilateral primary osteoarthritis, right knee: Secondary | ICD-10-CM

## 2016-06-24 ENCOUNTER — Encounter: Payer: Self-pay | Admitting: Internal Medicine

## 2016-06-29 ENCOUNTER — Ambulatory Visit (INDEPENDENT_AMBULATORY_CARE_PROVIDER_SITE_OTHER): Payer: 59 | Admitting: Physician Assistant

## 2016-06-29 DIAGNOSIS — M1711 Unilateral primary osteoarthritis, right knee: Secondary | ICD-10-CM

## 2016-07-15 ENCOUNTER — Other Ambulatory Visit (INDEPENDENT_AMBULATORY_CARE_PROVIDER_SITE_OTHER): Payer: Self-pay

## 2016-07-15 DIAGNOSIS — M25561 Pain in right knee: Principal | ICD-10-CM

## 2016-07-15 DIAGNOSIS — M25562 Pain in left knee: Principal | ICD-10-CM

## 2016-07-15 DIAGNOSIS — G8929 Other chronic pain: Secondary | ICD-10-CM

## 2016-07-15 MED ORDER — DICLOFENAC SODIUM 1 % TD GEL: 2.0000 g | Freq: Four times a day (QID) | TRANSDERMAL | Status: AC

## 2016-07-15 NOTE — Progress Notes (Signed)
oltaren gel

## 2016-08-14 ENCOUNTER — Ambulatory Visit (AMBULATORY_SURGERY_CENTER): Payer: Self-pay

## 2016-08-14 VITALS — Ht 66.0 in | Wt 165.2 lb

## 2016-08-14 DIAGNOSIS — Z860100 Personal history of colon polyps, unspecified: Secondary | ICD-10-CM

## 2016-08-14 MED ORDER — SUPREP BOWEL PREP KIT 17.5-3.13-1.6 GM/177ML PO SOLN: 1.0000 | Freq: Once | ORAL | 0 refills | Status: AC

## 2016-08-14 NOTE — Progress Notes (Signed)
No allergies to eggs or soy No past problems with anesthesia No diet meds No home oyxgen  Declined emmi

## 2016-08-17 ENCOUNTER — Encounter: Payer: Self-pay | Admitting: Internal Medicine

## 2016-08-20 ENCOUNTER — Encounter: Payer: Self-pay | Admitting: Internal Medicine

## 2016-08-20 ENCOUNTER — Ambulatory Visit (INDEPENDENT_AMBULATORY_CARE_PROVIDER_SITE_OTHER): Payer: 59 | Admitting: Internal Medicine

## 2016-08-20 DIAGNOSIS — J4521 Mild intermittent asthma with (acute) exacerbation: Secondary | ICD-10-CM

## 2016-08-20 MED ORDER — ALBUTEROL SULFATE HFA 108 (90 BASE) MCG/ACT IN AERS: 2.0000 | INHALATION_SPRAY | Freq: Four times a day (QID) | RESPIRATORY_TRACT | 12 refills | Status: DC | PRN

## 2016-08-20 MED ORDER — FLUTICASONE-SALMETEROL 100-50 MCG/DOSE IN AEPB: INHALATION_SPRAY | RESPIRATORY_TRACT | 12 refills | Status: DC

## 2016-08-20 MED ORDER — PREDNISONE 10 MG PO TABS: ORAL_TABLET | ORAL | 0 refills | Status: DC

## 2016-08-20 MED ORDER — ALBUTEROL SULFATE (2.5 MG/3ML) 0.083% IN NEBU: 2.5000 mg | INHALATION_SOLUTION | Freq: Four times a day (QID) | RESPIRATORY_TRACT | 12 refills | Status: DC | PRN

## 2016-08-20 MED ORDER — AZITHROMYCIN 250 MG PO TABS: ORAL_TABLET | ORAL | 1 refills | Status: DC

## 2016-08-20 NOTE — Progress Notes (Signed)
Subjective:    Patient ID: [name] [name], female    DOB: 10/09/1960, 55 y.o.   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  States her asthma is well maintained currently.    08/22/2015-55 year old female former minimal smoker, Therapist, sports for [employer], followed for allergic rhinitis, history otitis, asthma FOLLOWS FOR: Pt states she has done well overall except for when weather changes-gets slightly congested. Works from home now. Usually only has trouble if she gets a viral respiratory infection. Rare use of rescue inhaler. Continues Advair twice daily. She asks medications to carry with her for upcoming trip to [place]-antibiotic, prednisone taper.  08/20/2016-55 year old female former minimal smoker, Therapist, sports for [employer], followed for allergic rhinitis, history otitis, asthma FOLLOWS FOR: recent flare of ?bronchitis-pt states lungs feel clear but continues to cough.Gradually resolving from what she describes as a bad cold with yellow sputum. She had leftover prescriptions for Cipro and prednisone taper from last year so she started those and is now improving. No sore throat or fever. Has had flu shot. Occasional interval use of Advair and rescue inhaler without sleep disturbance.  ROS-see HPI Constitutional:   No-   weight loss, night sweats, fevers, chills, fatigue, lassitude. HEENT:   No-  headaches, difficulty swallowing, tooth/dental problems, sore throat,       No-  sneezing, itching, ear ache, nasal congestion, post nasal drip,  CV:  No-   chest pain, orthopnea, PND, swelling in lower extremities, anasarca, dizziness, palpitations Resp: No-   shortness of breath with exertion or at rest.      + productive cough,  No non-productive cough,  No- coughing up of blood.             +  change in color of mucus.  No- wheezing.   Skin: No-   rash or lesions. GI:  No-   heartburn, indigestion, abdominal pain, nausea, vomiting, GU: . MS:  No-   joint pain or swelling.   Neuro-     nothing unusual Psych:  No- change in mood or affect. No depression or anxiety.  No memory loss.   Objective:   Physical Exam General- Alert, Oriented, Affect-appropriate, Distress- none acute   Skin- rash-none, lesions- none, excoriation- none Lymphadenopathy- none Head- atraumatic            Eyes- Gross vision intact, PERRLA, conjunctivae clear secretions            Ears- Hearing, canals normal            Nose- Clear, Mild- Septal dev, narrower on left,    No- mucus, polyps, erosion, perforation             Throat- Mallampati II , mucosa clear , drainage- none, tonsils- atrophic Neck- flexible , trachea midline, no stridor , thyroid nl, carotid no bruit Chest - symmetrical excursion , unlabored           Heart/CV- RRR , no murmur , no gallop  , no rub, nl s1 s2                           - JVD- none , edema- none, stasis changes- none, varices-  none           Lung- clear to P&A, wheeze- none, cough+ Light, dullness-none, rub- none           Chest wall-  Abd-  Br/ Gen/ Rectal- Not done, not indicated Extrem- cyanosis- none, clubbing, none, atrophy- none, strength- nl Neuro- grossly intact to observation   Assessment & Plan:

## 2016-08-20 NOTE — Patient Instructions (Addendum)
Scripts printed for Zpak, neb solution and prednisone taper to hold  Scripts sent for albuterol HFA and Advair  Please call if we can help

## 2016-08-23 NOTE — Assessment & Plan Note (Signed)
We discussed management of what has been a purulent bronchitis. She will finish current Cipro and prednisone. She is given standby prescriptions for Z-Pak and prednisone to keep on hand in case needed over the next year.

## 2016-08-28 ENCOUNTER — Encounter: Payer: 59 | Admitting: Internal Medicine

## 2016-08-31 ENCOUNTER — Encounter: Payer: Self-pay | Admitting: Internal Medicine

## 2016-08-31 ENCOUNTER — Ambulatory Visit (AMBULATORY_SURGERY_CENTER): Payer: 59 | Admitting: Internal Medicine

## 2016-08-31 VITALS — BP 125/72 | HR 65 | Temp 97.8°F | Resp 16 | Ht 66.0 in | Wt 165.0 lb

## 2016-08-31 DIAGNOSIS — D123 Benign neoplasm of transverse colon: Secondary | ICD-10-CM

## 2016-08-31 DIAGNOSIS — Z86010 Personal history of colonic polyps: Secondary | ICD-10-CM

## 2016-08-31 DIAGNOSIS — D124 Benign neoplasm of descending colon: Secondary | ICD-10-CM

## 2016-08-31 MED ORDER — SODIUM CHLORIDE 0.9 % IV SOLN: 500.0000 mL | INTRAVENOUS | Status: DC

## 2016-08-31 NOTE — Patient Instructions (Signed)
  Handout given : Polyps   YOU HAD AN ENDOSCOPIC PROCEDURE TODAY AT [facility]:   Refer to the procedure report that was given to you for any specific questions about what was found during the examination.  If the procedure report does not answer your questions, please call your gastroenterologist to clarify.  If you requested that your care partner not be given the details of your procedure findings, then the procedure report has been included in a sealed envelope for you to review at your convenience later.  YOU SHOULD EXPECT: Some feelings of bloating in the abdomen. Passage of more gas than usual.  Walking can help get rid of the air that was put into your GI tract during the procedure and reduce the bloating. If you had a lower endoscopy (such as a colonoscopy or flexible sigmoidoscopy) you may notice spotting of blood in your stool or on the toilet paper. If you underwent a bowel prep for your procedure, you may not have a normal bowel movement for a few days.  Please Note:  You might notice some irritation and congestion in your nose or some drainage.  This is from the oxygen used during your procedure.  There is no need for concern and it should clear up in a day or so.  SYMPTOMS TO REPORT IMMEDIATELY:   Following lower endoscopy (colonoscopy or flexible sigmoidoscopy):  Excessive amounts of blood in the stool  Significant tenderness or worsening of abdominal pains  Swelling of the abdomen that is new, acute  Fever of 100F or higher   For urgent or emergent issues, a gastroenterologist can be reached at any hour by calling [phone].   DIET:  We do recommend a small meal at first, but then you may proceed to your regular diet.  Drink plenty of fluids but you should avoid alcoholic beverages for 24 hours.  ACTIVITY:  You should plan to take it easy for the rest of today and you should NOT DRIVE or use heavy machinery until tomorrow (because of the sedation  medicines used during the test).    FOLLOW UP: Our staff will call the number listed on your records the next business day following your procedure to check on you and address any questions or concerns that you may have regarding the information given to you following your procedure. If we do not reach you, we will leave a message.  However, if you are feeling well and you are not experiencing any problems, there is no need to return our call.  We will assume that you have returned to your regular daily activities without incident.  If any biopsies were taken you will be contacted by phone or by letter within the next 1-3 weeks.  Please call us at [phone] if you have not heard about the biopsies in 3 weeks.    SIGNATURES/CONFIDENTIALITY: You and/or your care partner have signed paperwork which will be entered into your electronic medical record.  These signatures attest to the fact that that the information above on your After Visit Summary has been reviewed and is understood.  Full responsibility of the confidentiality of this discharge information lies with you and/or your care-partner.

## 2016-08-31 NOTE — Progress Notes (Signed)
A/ox3, pleased with MAC, report to RN 

## 2016-08-31 NOTE — Op Note (Signed)
[place] Patient Name: [name] [name] Procedure Date: 08/31/2016 2:34 PM MRN: [id number] Endoscopist: Jerene Bears , MD Age: 55 Referring MD:  Date of Birth: 1961/04/11 Gender: Female Account #: [id number] Procedure:                Colonoscopy Indications:              Surveillance: Personal history of adenomatous                            polyps on last colonoscopy 3 years ago Medicines:                Monitored Anesthesia Care Procedure:                Pre-Anesthesia Assessment:                           - Prior to the procedure, a History and Physical                            was performed, and patient medications and                            allergies were reviewed. The patient's tolerance of                            previous anesthesia was also reviewed. The risks                            and benefits of the procedure and the sedation                            options and risks were discussed with the patient.                            All questions were answered, and informed consent                            was obtained. Prior Anticoagulants: The patient has                            taken no previous anticoagulant or antiplatelet                            agents. ASA Grade Assessment: II - A patient with                            mild systemic disease. After reviewing the risks                            and benefits, the patient was deemed in                            satisfactory condition to undergo the procedure.  After obtaining informed consent, the colonoscope                            was passed under direct vision. Throughout the                            procedure, the patient's blood pressure, pulse, and                            oxygen saturations were monitored continuously. The                            Model PCF-H190L [id number]) scope was introduced                            through the anus and  advanced to the the cecum,                            identified by appendiceal orifice and ileocecal                            valve. The colonoscopy was performed without                            difficulty. The patient tolerated the procedure                            well. The quality of the bowel preparation was                            good. The ileocecal valve, appendiceal orifice, and                            rectum were photographed. Scope In: 2:38:26 PM Scope Out: 2:54:10 PM Scope Withdrawal Time: 0 hours 10 minutes 32 seconds  Total Procedure Duration: 0 hours 15 minutes 44 seconds  Findings:                 The perianal and digital rectal examinations were                            normal.                           A 6 mm polyp was found in the transverse colon. The                            polyp was sessile. The polyp was removed with a                            cold snare. Resection and retrieval were complete.                           A 4 mm polyp was found in the descending colon. The  polyp was sessile. The polyp was removed with a                            cold snare. Resection and retrieval were complete.                           The exam was otherwise without abnormality on                            direct and retroflexion views. Complications:            No immediate complications. Estimated Blood Loss:     Estimated blood loss: none. Impression:               - One 6 mm polyp in the transverse colon, removed                            with a cold snare. Resected and retrieved.                           - One 4 mm polyp in the descending colon, removed                            with a cold snare. Resected and retrieved.                           - The examination was otherwise normal on direct                            and retroflexion views. Recommendation:           - Patient has a contact number available for                             emergencies. The signs and symptoms of potential                            delayed complications were discussed with the                            patient. Return to normal activities tomorrow.                            Written discharge instructions were provided to the                            patient.                           - Resume previous diet.                           - Continue present medications.                           - Await pathology results.                           -  Repeat colonoscopy in 5 years for surveillance. Jerene Bears, MD 08/31/2016 2:56:49 PM This report has been signed electronically.

## 2016-08-31 NOTE — Progress Notes (Signed)
Called to room to assist during endoscopic procedure.  Patient ID and intended procedure confirmed with present staff. Received instructions for my participation in the procedure from the performing physician.  

## 2016-09-01 ENCOUNTER — Telehealth: Payer: Self-pay

## 2016-09-01 NOTE — Telephone Encounter (Signed)
  Follow up Call-  Call back number 08/31/2016  Post procedure Call Back phone  # [phone]  Permission to leave phone message Yes  Some recent data might be hidden     Patient questions:  Do you have a fever, pain , or abdominal swelling? No. Pain Score  0 *  Have you tolerated food without any problems? Yes.    Have you been able to return to your normal activities? Yes.    Do you have any questions about your discharge instructions: Diet   No. Medications  No. Follow up visit  No.  Do you have questions or concerns about your Care? No.  Actions: * If pain score is 4 or above: No action needed, pain <4.

## 2016-09-01 NOTE — Telephone Encounter (Signed)
Name identifier. Left voicemail we will call back later today. 

## 2016-09-04 ENCOUNTER — Encounter: Payer: Self-pay | Admitting: Internal Medicine

## 2016-12-25 ENCOUNTER — Telehealth (INDEPENDENT_AMBULATORY_CARE_PROVIDER_SITE_OTHER): Payer: Self-pay | Admitting: *Deleted

## 2016-12-25 NOTE — Telephone Encounter (Signed)
Pt called stating she think Monday is 6 months from her last injection and would like to go ahead and get it pre approved so she can schedule this.

## 2016-12-29 NOTE — Telephone Encounter (Signed)
Do you have any Euflexxa form I can fill out for her?

## 2016-12-29 NOTE — Telephone Encounter (Signed)
Sure

## 2016-12-29 NOTE — Telephone Encounter (Signed)
Euflexxa being PA'd  Patient aware

## 2017-01-15 ENCOUNTER — Ambulatory Visit (INDEPENDENT_AMBULATORY_CARE_PROVIDER_SITE_OTHER): Payer: 59 | Admitting: Physician Assistant

## 2017-01-15 DIAGNOSIS — M1711 Unilateral primary osteoarthritis, right knee: Secondary | ICD-10-CM

## 2017-01-15 MED ORDER — METHYLPREDNISOLONE ACETATE 40 MG/ML IJ SUSP
40.0000 mg | INTRAMUSCULAR | Status: AC | PRN
  Administered 2017-01-15: 40 mg via INTRA_ARTICULAR

## 2017-01-15 MED ORDER — SODIUM HYALURONATE (VISCOSUP) 20 MG/2ML IX SOSY
20.0000 mg | PREFILLED_SYRINGE | INTRA_ARTICULAR | Status: AC | PRN
  Administered 2017-01-15: 20 mg via INTRA_ARTICULAR

## 2017-01-15 NOTE — Progress Notes (Signed)
   Procedure Note  Patient: [name] [name]             Date of Birth: December 27, 1960           MRN: [id number]             Visit Date: 01/15/2017 Mrs. Quentin Cornwall comes in today for her first few flex the injection of this series. She has known osteoarthritis of her right knee. Overall her right knee pain is improved she states that she feels it is improved greatly due to stretching generalized strengthening. She rarely using a knee brace at this point. Procedures: Visit Diagnoses: Primary osteoarthritis of right knee  Large Joint Inj Date/Time: 01/15/2017 4:48 PM Performed by: Pete Pelt Authorized by: Pete Pelt   Consent Given by:  Patient Indications:  Pain Location:  Knee Site:  R knee Needle Size:  22 G Approach:  Anterolateral Ultrasound Guidance: No   Fluoroscopic Guidance: No   Medications:  40 mg methylPREDNISolone acetate 40 MG/ML; 20 mg Sodium Hyaluronate 20 MG/2ML Aspiration Attempted: No   Patient tolerance:  Patient tolerated the procedure well with no immediate complications   Plan we'll see her back in 1 week for the second U flex injection of the series.

## 2017-01-21 ENCOUNTER — Encounter (INDEPENDENT_AMBULATORY_CARE_PROVIDER_SITE_OTHER): Payer: Self-pay | Admitting: Physician Assistant

## 2017-01-21 ENCOUNTER — Ambulatory Visit (INDEPENDENT_AMBULATORY_CARE_PROVIDER_SITE_OTHER): Payer: 59 | Admitting: Physician Assistant

## 2017-01-21 DIAGNOSIS — M1711 Unilateral primary osteoarthritis, right knee: Secondary | ICD-10-CM | POA: Diagnosis not present

## 2017-01-21 MED ORDER — HYLAN G-F 20 48 MG/6ML IX SOSY
48.0000 mg | PREFILLED_SYRINGE | INTRA_ARTICULAR | Status: AC | PRN
  Administered 2017-01-21: 48 mg via INTRA_ARTICULAR

## 2017-01-21 NOTE — Progress Notes (Signed)
   Procedure Note History of present illness: [name] [name] returns today for follow-up status post first she flexes injection of 3. She states she is overall doing well as had no complications. Physical exam: right knee no effusion abnormal warmth erythema or ecchymosis.  Patient: [name] [name]             Date of Birth: 06-28-1961           MRN: [id number]             Visit Date: 01/21/2017  Procedures: Visit Diagnoses: Primary osteoarthritis of right knee  Large Joint Inj Date/Time: 01/21/2017 4:58 PM Performed by: Pete Pelt Authorized by: Pete Pelt   Consent Given by:  Patient Location:  Knee Site:  R knee Needle Size:  22 G Needle Length:  1.5 inches Approach:  Anterolateral Ultrasound Guidance: No   Fluoroscopic Guidance: No   Arthrogram: No   Medications:  48 mg Hylan 48 MG/6ML Aspiration Attempted: No   Patient tolerance:  Patient tolerated the procedure well with no immediate complications   Plan: Follow-up in one week for the third Euflexxa injection.

## 2017-01-28 ENCOUNTER — Encounter (INDEPENDENT_AMBULATORY_CARE_PROVIDER_SITE_OTHER): Payer: Self-pay | Admitting: Physician Assistant

## 2017-01-28 ENCOUNTER — Ambulatory Visit (INDEPENDENT_AMBULATORY_CARE_PROVIDER_SITE_OTHER): Payer: 59 | Admitting: Physician Assistant

## 2017-01-28 VITALS — Ht 66.0 in | Wt 165.0 lb

## 2017-01-28 DIAGNOSIS — M1711 Unilateral primary osteoarthritis, right knee: Secondary | ICD-10-CM

## 2017-01-28 MED ORDER — LIDOCAINE HCL 1 % IJ SOLN
1.0000 mL | INTRAMUSCULAR | Status: AC | PRN
  Administered 2017-01-28: 1 mL

## 2017-01-28 MED ORDER — SODIUM HYALURONATE (VISCOSUP) 20 MG/2ML IX SOSY
20.0000 mg | PREFILLED_SYRINGE | INTRA_ARTICULAR | Status: AC | PRN
  Administered 2017-01-28: 20 mg via INTRA_ARTICULAR

## 2017-01-28 NOTE — Progress Notes (Signed)
   Procedure Note History of present illness: [name] returns today stating that the U flex injections are helping in her knee. She's had no adverse effects to the injections.  Patient: [name] [name]             Date of Birth: 1961-05-23           MRN: [id number]             Visit Date: 01/28/2017  Procedures: Visit Diagnoses: No diagnosis found.  Large Joint Inj Date/Time: 01/28/2017 4:29 PM Performed by: Pete Pelt Authorized by: Pete Pelt   Consent Given by:  Patient Indications:  Pain Location:  Knee Site:  R knee Needle Size:  22 G Approach:  Anterolateral Ultrasound Guidance: No   Fluoroscopic Guidance: No   Medications:  1 mL lidocaine 1 %; 20 mg Sodium Hyaluronate 20 MG/2ML Aspiration Attempted: No   Patient tolerance:  Patient tolerated the procedure well with no immediate complications   Plan she will call in November to schedule her reflex in injections again sometime in early December. She understands that she can have cortisone injections every 3 months she needs in the interim.

## 2017-06-22 ENCOUNTER — Telehealth (INDEPENDENT_AMBULATORY_CARE_PROVIDER_SITE_OTHER): Payer: Self-pay | Admitting: Orthopaedic Surgery

## 2017-06-22 NOTE — Telephone Encounter (Signed)
Patient would like for you to call her.  CB#[phone].  Thank you.

## 2017-06-22 NOTE — Telephone Encounter (Signed)
Euflexxa 

## 2017-06-28 NOTE — Telephone Encounter (Signed)
Will order after 11/17

## 2017-07-25 DIAGNOSIS — J0141 Acute recurrent pansinusitis: Secondary | ICD-10-CM | POA: Diagnosis not present

## 2017-07-25 DIAGNOSIS — J060 Acute laryngopharyngitis: Secondary | ICD-10-CM | POA: Diagnosis not present

## 2017-07-25 DIAGNOSIS — N951 Menopausal and female climacteric states: Secondary | ICD-10-CM | POA: Insufficient documentation

## 2017-07-25 DIAGNOSIS — R030 Elevated blood-pressure reading, without diagnosis of hypertension: Secondary | ICD-10-CM | POA: Diagnosis not present

## 2017-07-25 DIAGNOSIS — J069 Acute upper respiratory infection, unspecified: Secondary | ICD-10-CM | POA: Diagnosis not present

## 2017-07-30 ENCOUNTER — Telehealth (INDEPENDENT_AMBULATORY_CARE_PROVIDER_SITE_OTHER): Payer: Self-pay | Admitting: Orthopaedic Surgery

## 2017-07-30 NOTE — Telephone Encounter (Signed)
Patient called asking about a Euflexxa injection and wanted to speak with you about setting up an appointment once its approved by her insurance. CB # [phone]

## 2017-08-02 NOTE — Telephone Encounter (Signed)
You can make her and appt first week of December, I should have approval by then

## 2017-08-18 ENCOUNTER — Other Ambulatory Visit: Payer: Self-pay | Admitting: Internal Medicine

## 2017-08-19 ENCOUNTER — Encounter (INDEPENDENT_AMBULATORY_CARE_PROVIDER_SITE_OTHER): Payer: Self-pay | Admitting: Physician Assistant

## 2017-08-19 ENCOUNTER — Ambulatory Visit (INDEPENDENT_AMBULATORY_CARE_PROVIDER_SITE_OTHER): Payer: 59 | Admitting: Orthopaedic Surgery

## 2017-08-19 ENCOUNTER — Ambulatory Visit (INDEPENDENT_AMBULATORY_CARE_PROVIDER_SITE_OTHER): Payer: 59 | Admitting: Physician Assistant

## 2017-08-19 DIAGNOSIS — M1711 Unilateral primary osteoarthritis, right knee: Secondary | ICD-10-CM | POA: Diagnosis not present

## 2017-08-19 MED ORDER — SODIUM HYALURONATE (VISCOSUP) 20 MG/2ML IX SOSY
20.0000 mg | PREFILLED_SYRINGE | INTRA_ARTICULAR | Status: AC | PRN
  Administered 2017-08-19: 20 mg via INTRA_ARTICULAR

## 2017-08-19 MED ORDER — HYALURONAN 88 MG/4ML IX SOSY
88.0000 mg | PREFILLED_SYRINGE | INTRA_ARTICULAR | Status: AC | PRN
  Administered 2017-08-19: 88 mg via INTRA_ARTICULAR

## 2017-08-19 NOTE — Progress Notes (Signed)
   Procedure Note  Patient: [name] [name]             Date of Birth: 09/27/1960           MRN: [id number]             Visit Date: 08/19/2017 HPI: [name] [name] returns today for repeat series of right knee Euflexxa injections.  Last injections were done 5 of 18.  States overall the knee is doing well has good days bad days.  No longer having to wear a brace except for very rarely as she has seen improvement in her right knee pain with the supplemental injections.  Physical exam: Right knee full extension full flexion.  Slight effusion.  No instability Procedures: Visit Diagnoses: Primary osteoarthritis of right knee  Large Joint Inj on 08/19/2017 8:49 PM Indications: pain Details: 22 G 1.5 in needle, anterolateral approach  Arthrogram: No  Medications: 88 mg Hyaluronan 88 MG/4ML; 20 mg Sodium Hyaluronate 20 MG/2ML Aspirate: 20 mL yellow Outcome: tolerated well, no immediate complications Procedure, treatment alternatives, risks and benefits explained, specific risks discussed. Consent was given by the patient. Immediately prior to procedure a time out was called to verify the correct patient, procedure, equipment, support staff and site/side marked as required. Patient was prepped and draped in the usual sterile fashion.     Plan: She will follow-up in 1 week for second Euflexxa injection.  Questions encouraged and answered.

## 2017-08-23 ENCOUNTER — Ambulatory Visit: Payer: 59 | Admitting: Internal Medicine

## 2017-08-25 ENCOUNTER — Telehealth: Payer: Self-pay | Admitting: Internal Medicine

## 2017-08-25 ENCOUNTER — Ambulatory Visit (INDEPENDENT_AMBULATORY_CARE_PROVIDER_SITE_OTHER): Payer: 59 | Admitting: Physician Assistant

## 2017-08-25 ENCOUNTER — Encounter (INDEPENDENT_AMBULATORY_CARE_PROVIDER_SITE_OTHER): Payer: Self-pay | Admitting: Physician Assistant

## 2017-08-25 DIAGNOSIS — M1711 Unilateral primary osteoarthritis, right knee: Secondary | ICD-10-CM

## 2017-08-25 MED ORDER — AZITHROMYCIN 250 MG PO TABS: ORAL_TABLET | ORAL | 0 refills | Status: DC

## 2017-08-25 MED ORDER — PREDNISONE 10 MG PO TABS: ORAL_TABLET | ORAL | 0 refills | Status: DC

## 2017-08-25 NOTE — Telephone Encounter (Signed)
Spoke with pt, she states she went to [place] and [place] gave her an ABX and prednisone. She had an appt on Monday but we were closed due to weather. CY can you write ABX and Prednisone so pt can take on her trip if she needs to? Please advise.   [place]  Current Outpatient Medications on File Prior to Visit  Medication Sig Dispense Refill  . albuterol (PROVENTIL HFA;VENTOLIN HFA) 108 (90 Base) MCG/ACT inhaler Inhale 2 puffs into the lungs every 6 (six) hours as needed for wheezing or shortness of breath. 1 Inhaler 12  . albuterol (PROVENTIL) (2.5 MG/3ML) 0.083% nebulizer solution Take 3 mLs (2.5 mg total) by nebulization every 6 (six) hours as needed for wheezing or shortness of breath. 75 mL 12  . Calcium Carbonate-Vit D-Min 600-400 MG-UNIT TABS Take 1 tablet by mouth daily.      . cetirizine (ZYRTEC) 10 MG tablet Take 10 mg by mouth daily.      [name] estradiol (VIVELLE-DOT) 0.05 MG/24HR patch Place 1 patch onto the skin 2 (two) times a week.    . fluticasone (FLONASE) 50 MCG/ACT nasal spray Place into both nostrils daily.    . Fluticasone-Salmeterol (ADVAIR DISKUS) 100-50 MCG/DOSE AEPB INHALE 1 PUFF INTO THE LUNGS TWO TIMES DAILY. RINSE MOUTH 60 each 12  . lisinopril (PRINIVIL,ZESTRIL) 10 MG tablet Take 10 mg by mouth daily.    . Misc Natural Products (PROGESTERONE) 1000 MG/60GM CREA Apply topically.      . Multiple Vitamin (MULTIVITAMIN) capsule Take 1 capsule by mouth daily.      . nebivolol (BYSTOLIC) 10 MG tablet Take 10 mg by mouth daily.     Current Facility-Administered Medications on File Prior to Visit  Medication Dose Route Frequency Provider Last Rate Last Dose  . 0.9 %  sodium chloride infusion  500 mL Intravenous Continuous Pyrtle, Lajuan Lines, MD      . diclofenac sodium (VOLTAREN) 1 % transdermal gel 2 g  2 g Topical QID Pete Pelt, PA-C       Allergies  Allergen Reactions  . Bee Venom Anaphylaxis  . Amlodipine     Feet swelled  . Benicar [Olmesartan]     diarrhea   . Codeine     REACTION: nausea  . Flagyl [Metronidazole]     Nausea and vomiting  . Hctz [Hydrochlorothiazide]     itching

## 2017-08-25 NOTE — Telephone Encounter (Signed)
Offer Rx Zpak   250 mg, # 6   2 on first day, then one daily                 Prednisone 10 mg, # 20, 4 X 2 DAYS, 3 X 2 DAYS, 2 X 2 DAYS, 1 X 2 DAYS

## 2017-08-25 NOTE — Telephone Encounter (Signed)
Spoke with pt, aware of recs.  rx sent to preferred pharmacy.  Nothing further needed.  

## 2017-08-25 NOTE — Progress Notes (Signed)
   Procedure Note  Patient: [name] [name]             Date of Birth: December 18, 1960           MRN: [id number]             Visit Date: 08/25/2017 HPI: Ms. Solimine returns today for follow-up right knee osteoarthritis for her second Euflexxa of 3 injections.  She states she was sore after the aspiration and injection last week.  Otherwise she is not able to tell any results of the injection so far.  Right knee: Good range of motion.  No effusion no warmth erythema or ecchymosis.  Procedures: Visit Diagnoses: Primary osteoarthritis of right knee  Large Joint Inj: R knee on 08/25/2017 3:00 PM Indications: pain Details: 25 G 1.5 in needle, anterolateral approach  Arthrogram: No  Outcome: tolerated well, no immediate complications Procedure, treatment alternatives, risks and benefits explained, specific risks discussed. Consent was given by the patient. Immediately prior to procedure a time out was called to verify the correct patient, procedure, equipment, support staff and site/side marked as required. Patient was prepped and draped in the usual sterile fashion.     Plan: She will follow-up with [place] next Tuesday for her third injection of Euflexxa in the right knee.

## 2017-08-25 NOTE — Telephone Encounter (Signed)
lmtcb X1 for pt  

## 2017-08-31 ENCOUNTER — Encounter (INDEPENDENT_AMBULATORY_CARE_PROVIDER_SITE_OTHER): Payer: Self-pay | Admitting: Orthopaedic Surgery

## 2017-08-31 ENCOUNTER — Ambulatory Visit (INDEPENDENT_AMBULATORY_CARE_PROVIDER_SITE_OTHER): Payer: 59 | Admitting: Orthopaedic Surgery

## 2017-08-31 DIAGNOSIS — M1711 Unilateral primary osteoarthritis, right knee: Secondary | ICD-10-CM | POA: Diagnosis not present

## 2017-08-31 MED ORDER — SODIUM HYALURONATE (VISCOSUP) 20 MG/2ML IX SOSY
20.0000 mg | PREFILLED_SYRINGE | INTRA_ARTICULAR | Status: AC | PRN
  Administered 2017-08-31: 20 mg via INTRA_ARTICULAR

## 2017-08-31 NOTE — Progress Notes (Signed)
   Procedure Note  Patient: [name] [name]             Date of Birth: 05/05/1961           MRN: [id number]             Visit Date: 08/31/2017  Procedures: Visit Diagnoses: Primary osteoarthritis of right knee  Large Joint Inj: R knee on 08/31/2017 10:21 AM Indications: diagnostic evaluation and pain Details: 22 G 1.5 in needle, superolateral approach  Arthrogram: No  Medications: 20 mg Sodium Hyaluronate 20 MG/2ML Outcome: tolerated well, no immediate complications Procedure, treatment alternatives, risks and benefits explained, specific risks discussed. Consent was given by the patient. Immediately prior to procedure a time out was called to verify the correct patient, procedure, equipment, support staff and site/side marked as required. Patient was prepped and draped in the usual sterile fashion.    The patient is here today for a hyaluronic acid injection in her right knee to treat the moderate arthritis pain.  This is a series of 3 injections and this is #3 of Euflexxa.  She is done well with the other injections.  She has no complaints otherwise.  On examination of her right knee she has global tenderness with good range of motion.  The knee is ligamentously stable.  She tolerated the injection well.  We had a long and thorough discussion about follow-up.  He can always place a steroid injection in her knee in 3 months if needed.  She will otherwise follow-up as needed.

## 2017-09-21 ENCOUNTER — Other Ambulatory Visit: Payer: Self-pay | Admitting: Internal Medicine

## 2017-12-06 DIAGNOSIS — Z85828 Personal history of other malignant neoplasm of skin: Secondary | ICD-10-CM | POA: Insufficient documentation

## 2017-12-09 ENCOUNTER — Encounter: Payer: Self-pay | Admitting: Internal Medicine

## 2017-12-09 ENCOUNTER — Ambulatory Visit: Payer: 59 | Admitting: Internal Medicine

## 2017-12-09 DIAGNOSIS — J4521 Mild intermittent asthma with (acute) exacerbation: Secondary | ICD-10-CM

## 2017-12-09 DIAGNOSIS — J301 Allergic rhinitis due to pollen: Secondary | ICD-10-CM | POA: Diagnosis not present

## 2017-12-09 MED ORDER — FLUTICASONE-SALMETEROL 100-50 MCG/DOSE IN AEPB: INHALATION_SPRAY | RESPIRATORY_TRACT | 12 refills | Status: DC

## 2017-12-09 MED ORDER — ALBUTEROL SULFATE HFA 108 (90 BASE) MCG/ACT IN AERS: INHALATION_SPRAY | RESPIRATORY_TRACT | 12 refills | Status: DC

## 2017-12-09 NOTE — Patient Instructions (Signed)
Ok to continue present meds.  Refills sent for Ventolin and Advair.  Please cal if we can help

## 2017-12-09 NOTE — Assessment & Plan Note (Signed)
Flonase and Claritin are likely to be sufficient this year as pollen season progresses.

## 2017-12-09 NOTE — Assessment & Plan Note (Signed)
She continues Advair but is well in between acute episodes which are mostly viral triggered now.  She does feel Advair is useful.  We discussed reasonable expectation for numbers of colds in a year.  I do not think we need to increase her coverage. Plan-refill Advair and albuterol

## 2017-12-09 NOTE — Progress Notes (Signed)
Subjective:    Patient ID: [name] [name], female    DOB: [birth year], 57 y.o.   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 She had leftover prescriptions for Cipro and prednisone taper from last year so she started those and is now improving. No sore throat or fever. Has had flu shot. Occasional interval use of Advair and rescue inhaler without sleep disturbance.  12/09/17- 57 year old female former minimal smoker, Therapist, sports for [employer], followed for allergic rhinitis, history otitis, asthma ----Asthma; Pt has been wheezing more lately due to recent cold; feels like she is getting better.  Ventolin HFA, Advair 100, Flonase, Zyrtec, neb albuterol Now working for Hospice with more sick contact.  Has had some colds this winter.  Used leftover prednisone taper.  Still coughing for the most recent episode but now mucus is clear.  She is getting better.  Bronchitis 1 year ago happened with fever, 1 day after she cleaned something moldy out of her refrigerator.  Not sure if this was coincidence.  Current meds are usually sufficient.  Uses rescue inhaler mainly if she has a cold.  ROS-see HPI + = positive Constitutional:   No-   weight loss, night sweats, fevers, chills, fatigue, lassitude. HEENT:   No-  headaches, difficulty swallowing, tooth/dental problems, sore throat,       No-  sneezing, itching, ear ache, nasal congestion, post nasal drip,  CV:  No-   chest pain, orthopnea, PND, swelling in lower extremities, anasarca,  dizziness, palpitations Resp: No-   shortness of breath with exertion or at rest.         + productive cough,  No non-productive cough,  No- coughing up of blood.             change in color of mucus.  No- wheezing.   Skin: No-   rash or lesions. GI:  No-   heartburn, indigestion, abdominal pain, nausea, vomiting, GU: . MS:  No-   joint pain or swelling.   Neuro-     nothing unusual Psych:  No- change in mood or affect. No depression or anxiety.  No memory loss.   Objective:   Physical Exam General- Alert, Oriented, Affect-appropriate, Distress- none acute   Skin- rash-none, lesions- none, excoriation- none Lymphadenopathy- none Head- atraumatic            Eyes- Gross vision intact, PERRLA, conjunctivae clear secretions            Ears- Hearing, canals normal            Nose- Clear, Mild- Septal dev, narrower on left,    No- mucus, polyps, erosion, perforation             Throat- Mallampati II , mucosa clear , drainage- none, tonsils- atrophic Neck- flexible , trachea midline, no stridor , thyroid nl, carotid no bruit Chest - symmetrical excursion , unlabored           Heart/CV- RRR , no murmur , no gallop  , no rub, nl s1 s2                           -  JVD- none , edema- none, stasis changes- none, varices- none           Lung- clear to P&A, wheeze- none, cough+ Light, dullness-none, rub- none           Chest wall-  Abd-  Br/ Gen/ Rectal- Not done, not indicated Extrem- cyanosis- none, clubbing, none, atrophy- none, strength- nl Neuro- grossly intact to observation   Assessment & Plan:

## 2017-12-18 ENCOUNTER — Encounter: Payer: Self-pay | Admitting: Internal Medicine

## 2017-12-20 MED ORDER — UNABLE TO FIND: 0 refills | Status: DC

## 2017-12-20 NOTE — Telephone Encounter (Signed)
Letter done, signed and placed in Triage until we hear back from the pt as to whether she wants this mailed or picked up

## 2017-12-20 NOTE — Telephone Encounter (Signed)
----   Message -----  From: Farrel Gordon  Sent: 4/6/20193:48 PM EDT  To: Baird Lyons, MD Subject: Non-Urgent Medical Question  Could you please provide a note or script approving an allergy mattress cover so FSA will cover the cost to the item?If you have any questions regarding this please call me at [phone].  Thank you Bevely Palmer  Dr. Annamaria Boots, please advise. Thanks!

## 2017-12-20 NOTE — Addendum Note (Signed)
Addended by: Rosana Berger on: 12/20/2017 11:23 AM   Modules accepted: Orders

## 2017-12-20 NOTE — Telephone Encounter (Signed)
Please print a script for an allergy/ dust mite mattress cover.  Dx asthma, allergic rhinitis.

## 2017-12-21 NOTE — Telephone Encounter (Signed)
Letter placed in outgoing mail per patient request. Nothing further is needed at this time.

## 2018-03-24 DIAGNOSIS — Z6826 Body mass index (BMI) 26.0-26.9, adult: Secondary | ICD-10-CM | POA: Diagnosis not present

## 2018-03-24 DIAGNOSIS — Z01419 Encounter for gynecological examination (general) (routine) without abnormal findings: Secondary | ICD-10-CM | POA: Diagnosis not present

## 2018-03-24 DIAGNOSIS — Z1231 Encounter for screening mammogram for malignant neoplasm of breast: Secondary | ICD-10-CM | POA: Diagnosis not present

## 2018-03-24 DIAGNOSIS — N951 Menopausal and female climacteric states: Secondary | ICD-10-CM | POA: Diagnosis not present

## 2018-04-18 ENCOUNTER — Telehealth (INDEPENDENT_AMBULATORY_CARE_PROVIDER_SITE_OTHER): Payer: Self-pay | Admitting: Orthopaedic Surgery

## 2018-04-18 NOTE — Telephone Encounter (Signed)
Patient left a voicemail wanting to speak with you concerning injections. CB # [phone]

## 2018-04-18 NOTE — Telephone Encounter (Signed)
Right knee euflexxa

## 2018-04-21 ENCOUNTER — Telehealth (INDEPENDENT_AMBULATORY_CARE_PROVIDER_SITE_OTHER): Payer: Self-pay

## 2018-04-21 NOTE — Telephone Encounter (Signed)
Noted  

## 2018-04-21 NOTE — Telephone Encounter (Signed)
Submitted for VOB, Euflexxa series, right knee.

## 2018-04-22 DIAGNOSIS — S39012A Strain of muscle, fascia and tendon of lower back, initial encounter: Secondary | ICD-10-CM | POA: Diagnosis not present

## 2018-04-22 DIAGNOSIS — M545 Low back pain: Secondary | ICD-10-CM | POA: Diagnosis not present

## 2018-04-22 DIAGNOSIS — M5136 Other intervertebral disc degeneration, lumbar region: Secondary | ICD-10-CM | POA: Diagnosis not present

## 2018-05-03 ENCOUNTER — Telehealth (INDEPENDENT_AMBULATORY_CARE_PROVIDER_SITE_OTHER): Payer: Self-pay | Admitting: Orthopaedic Surgery

## 2018-05-03 NOTE — Telephone Encounter (Signed)
Can we check on her Euflexxa?

## 2018-05-03 NOTE — Telephone Encounter (Signed)
Patient left a voicemail request a call back from you regarding some medications. Return call to # [phone]

## 2018-05-05 NOTE — Telephone Encounter (Signed)
PA was initiated, advised PA form will be faxed to our office today, 05/05/2018 by [employer].  Pa currently pending.   Talked with patient and advised her message above.  Patient understands.

## 2018-05-06 ENCOUNTER — Telehealth (INDEPENDENT_AMBULATORY_CARE_PROVIDER_SITE_OTHER): Payer: Self-pay

## 2018-05-06 NOTE — Telephone Encounter (Signed)
PA required for Eulfexxa series, Right Knee Faxed completed PA form to [employer] at [phone].

## 2018-05-31 ENCOUNTER — Telehealth (INDEPENDENT_AMBULATORY_CARE_PROVIDER_SITE_OTHER): Payer: Self-pay

## 2018-05-31 NOTE — Telephone Encounter (Signed)
Per rep.with [employer] prior authorization, authorization is approved for Euflexxa series, right knee. Auth.# is [id number] Valid 05/06/2018- 08/04/2018 Per rep.with [employer] Falling, approval letter will be faxed today, 05/28/2018.

## 2018-06-02 ENCOUNTER — Encounter (INDEPENDENT_AMBULATORY_CARE_PROVIDER_SITE_OTHER): Payer: Self-pay | Admitting: Physician Assistant

## 2018-06-02 ENCOUNTER — Telehealth (INDEPENDENT_AMBULATORY_CARE_PROVIDER_SITE_OTHER): Payer: Self-pay

## 2018-06-02 ENCOUNTER — Ambulatory Visit (INDEPENDENT_AMBULATORY_CARE_PROVIDER_SITE_OTHER): Payer: 59 | Admitting: Physician Assistant

## 2018-06-02 DIAGNOSIS — M1711 Unilateral primary osteoarthritis, right knee: Secondary | ICD-10-CM | POA: Diagnosis not present

## 2018-06-02 DIAGNOSIS — Z23 Encounter for immunization: Secondary | ICD-10-CM | POA: Diagnosis not present

## 2018-06-02 MED ORDER — SODIUM HYALURONATE (VISCOSUP) 20 MG/2ML IX SOSY
20.0000 mg | PREFILLED_SYRINGE | INTRA_ARTICULAR | Status: AC | PRN
  Administered 2018-06-02: 20 mg via INTRA_ARTICULAR

## 2018-06-02 NOTE — Telephone Encounter (Signed)
Patient is approved for Euflexxa series, right knee. [place] Covered at 100% through insurance Co-pay $40.00 each visit PA required PA Approval# [id number] Valid 05/06/2018- 08/04/2018 Appt.scheduled 06/02/2018

## 2018-06-02 NOTE — Progress Notes (Signed)
   Procedure Note  Patient: [name] [name]             Date of Birth: Mar 12, 1961           MRN: [id number]             Visit Date: 06/02/2018 HPI: Mr. Junious Silk is well-known to Dr. Ninfa Linden service comes in today for repeat series of right knee Euflexxa injections.  She has known osteoarthritis of the right knee.  States overall the knee has had some increased pain over the last few weeks and she has had some difficulty weightbearing due to the pain in the knee.  However today she is having an overall good day.  Physical exam: Right knee positive effusion.  Overall good range of motion.  No abnormal warmth erythema.  No instability valgus varus stressing.   Procedures: Visit Diagnoses: Unilateral primary osteoarthritis, right knee  Large Joint Inj: R knee on 06/02/2018 5:34 PM Indications: pain Details: 22 G 1.5 in needle, anterolateral approach  Arthrogram: No  Medications: 20 mg Sodium Hyaluronate 20 MG/2ML Outcome: tolerated well, no immediate complications Procedure, treatment alternatives, risks and benefits explained, specific risks discussed. Consent was given by the patient. Immediately prior to procedure a time out was called to verify the correct patient, procedure, equipment, support staff and site/side marked as required. Patient was prepped and draped in the usual sterile fashion.     Plan: We will see her back in 1 week for her second Euflexxa injection.

## 2018-06-09 ENCOUNTER — Ambulatory Visit (INDEPENDENT_AMBULATORY_CARE_PROVIDER_SITE_OTHER): Payer: 59 | Admitting: Physician Assistant

## 2018-06-09 DIAGNOSIS — M1711 Unilateral primary osteoarthritis, right knee: Secondary | ICD-10-CM

## 2018-06-09 MED ORDER — SODIUM HYALURONATE (VISCOSUP) 20 MG/2ML IX SOSY
20.0000 mg | PREFILLED_SYRINGE | INTRA_ARTICULAR | Status: AC | PRN
  Administered 2018-06-09: 20 mg via INTRA_ARTICULAR

## 2018-06-09 NOTE — Progress Notes (Signed)
   Procedure Note  Patient: Farrel Gordon             Date of Birth: 02-10-1961           MRN: [id number]             Visit Date: 06/09/2018  HPI: Mrs. Lehane returns today for second Euflexxa injection.  She has had no adverse effects to the injection.  She has had no response as of yet.[name]  Physical exam: Right knee no effusion abnormal warmth erythema good range of motion. Procedures: Visit Diagnoses: Unilateral primary osteoarthritis, right knee  Large Joint Inj on 06/09/2018 4:36 PM Indications: pain Details: 22 G 1.5 in needle, superolateral approach  Arthrogram: No  Medications: 20 mg Sodium Hyaluronate 20 MG/2ML Outcome: tolerated well, no immediate complications Procedure, treatment alternatives, risks and benefits explained, specific risks discussed. Consent was given by the patient. Immediately prior to procedure a time out was called to verify the correct patient, procedure, equipment, support staff and site/side marked as required. Patient was prepped and draped in the usual sterile fashion.    Plan: She will follow-up in 1 week for third and final Euflexxa injection.

## 2018-06-16 ENCOUNTER — Ambulatory Visit (INDEPENDENT_AMBULATORY_CARE_PROVIDER_SITE_OTHER): Payer: 59 | Admitting: Orthopaedic Surgery

## 2018-06-16 ENCOUNTER — Encounter (INDEPENDENT_AMBULATORY_CARE_PROVIDER_SITE_OTHER): Payer: Self-pay | Admitting: Orthopaedic Surgery

## 2018-06-16 DIAGNOSIS — M1711 Unilateral primary osteoarthritis, right knee: Secondary | ICD-10-CM | POA: Diagnosis not present

## 2018-06-16 MED ORDER — SODIUM HYALURONATE (VISCOSUP) 20 MG/2ML IX SOSY
20.0000 mg | PREFILLED_SYRINGE | INTRA_ARTICULAR | Status: AC | PRN
  Administered 2018-06-16: 20 mg via INTRA_ARTICULAR

## 2018-06-16 NOTE — Progress Notes (Signed)
   Procedure Note  Patient: [name] [name]             Date of Birth: December 23, 1960           MRN: [id number]             Visit Date: 06/16/2018  Procedures: Visit Diagnoses: Primary osteoarthritis of right knee  Large Joint Inj: R knee on 06/16/2018 4:01 PM Indications: diagnostic evaluation and pain Details: 22 G 1.5 in needle, superolateral approach  Arthrogram: No  Medications: 20 mg Sodium Hyaluronate 20 MG/2ML Outcome: tolerated well, no immediate complications Procedure, treatment alternatives, risks and benefits explained, specific risks discussed. Consent was given by the patient. Immediately prior to procedure a time out was called to verify the correct patient, procedure, equipment, support staff and site/side marked as required. Patient was prepped and draped in the usual sterile fashion.    The patient is here today for a hyaluronic acid injection in her right knee.  This is #3 of the series of 3 Euflexxa injections.  She is doing well overall.  She feels a difference after injection #2.  She has no significant issues.  Examination of her right knee shows no significant effusion at all.  She has excellent range of motion of the knee as well.  She understands that we are providing this injection to treat the pain from moderate osteoarthritis.  She tolerated it well.  All question concerns were answered and addressed.  Follow-up will be as needed.

## 2018-08-24 ENCOUNTER — Other Ambulatory Visit: Payer: Self-pay | Admitting: Internal Medicine

## 2018-09-22 ENCOUNTER — Ambulatory Visit (INDEPENDENT_AMBULATORY_CARE_PROVIDER_SITE_OTHER): Payer: 59 | Admitting: Physician Assistant

## 2018-09-22 ENCOUNTER — Encounter (INDEPENDENT_AMBULATORY_CARE_PROVIDER_SITE_OTHER): Payer: Self-pay | Admitting: Physician Assistant

## 2018-09-22 ENCOUNTER — Ambulatory Visit (INDEPENDENT_AMBULATORY_CARE_PROVIDER_SITE_OTHER): Payer: Self-pay

## 2018-09-22 DIAGNOSIS — M25561 Pain in right knee: Secondary | ICD-10-CM

## 2018-09-22 DIAGNOSIS — M1711 Unilateral primary osteoarthritis, right knee: Secondary | ICD-10-CM

## 2018-09-22 DIAGNOSIS — G8929 Other chronic pain: Secondary | ICD-10-CM

## 2018-09-22 MED ORDER — TRAMADOL HCL 50 MG PO TABS: 50.0000 mg | ORAL_TABLET | Freq: Four times a day (QID) | ORAL | 1 refills | Status: DC | PRN

## 2018-09-22 NOTE — Progress Notes (Signed)
HPI: [name] comes in today just wanting some tramadol.  She is well-known to our service has end-stage arthritis of her right knee.She gets supplemental injections for her knee and has done well with these in regards to her pain. No charge for office visit follow up as needed.

## 2018-12-05 MED ORDER — ALBUTEROL SULFATE (2.5 MG/3ML) 0.083% IN NEBU: 2.5000 mg | INHALATION_SOLUTION | Freq: Four times a day (QID) | RESPIRATORY_TRACT | 12 refills | Status: DC | PRN

## 2018-12-08 ENCOUNTER — Other Ambulatory Visit (INDEPENDENT_AMBULATORY_CARE_PROVIDER_SITE_OTHER): Payer: Self-pay | Admitting: Physician Assistant

## 2018-12-09 NOTE — Telephone Encounter (Signed)
Do you want to refill? 

## 2018-12-12 ENCOUNTER — Ambulatory Visit: Payer: 59 | Admitting: Internal Medicine

## 2018-12-19 ENCOUNTER — Encounter (INDEPENDENT_AMBULATORY_CARE_PROVIDER_SITE_OTHER): Payer: Self-pay

## 2018-12-21 ENCOUNTER — Telehealth (INDEPENDENT_AMBULATORY_CARE_PROVIDER_SITE_OTHER): Payer: Self-pay

## 2018-12-21 NOTE — Telephone Encounter (Signed)
Submitted VOB for Euflexxa series, right knee.

## 2018-12-27 ENCOUNTER — Telehealth (INDEPENDENT_AMBULATORY_CARE_PROVIDER_SITE_OTHER): Payer: Self-pay

## 2018-12-27 NOTE — Telephone Encounter (Signed)
PA required for Euflexxa, right knee. Per Carney Harder, PA form will be faxed to our office today, 12/27/2018.  Once received, will complete and fax.

## 2018-12-28 ENCOUNTER — Telehealth (INDEPENDENT_AMBULATORY_CARE_PROVIDER_SITE_OTHER): Payer: Self-pay

## 2018-12-28 NOTE — Telephone Encounter (Signed)
PA required for Euflexxa, right knee. Faxed completed PA form to [place] at [phone].

## 2019-01-03 ENCOUNTER — Telehealth (INDEPENDENT_AMBULATORY_CARE_PROVIDER_SITE_OTHER): Payer: Self-pay

## 2019-01-03 NOTE — Telephone Encounter (Signed)
Talked with patient and advised her that she is approved for gel injection.  Patient is approved for Euflexxa series, right knee. [employer] Covered at 100% of the allowable amount. Co-pay of $65.00 PA required PA Approval# [id number] Valid 12/28/2018- 12/27/2019  Appt. 01/05/2019

## 2019-01-05 ENCOUNTER — Ambulatory Visit (INDEPENDENT_AMBULATORY_CARE_PROVIDER_SITE_OTHER): Payer: 59 | Admitting: Orthopaedic Surgery

## 2019-01-05 ENCOUNTER — Other Ambulatory Visit: Payer: Self-pay

## 2019-01-05 ENCOUNTER — Encounter (INDEPENDENT_AMBULATORY_CARE_PROVIDER_SITE_OTHER): Payer: Self-pay | Admitting: Orthopaedic Surgery

## 2019-01-05 DIAGNOSIS — M1711 Unilateral primary osteoarthritis, right knee: Secondary | ICD-10-CM | POA: Diagnosis not present

## 2019-01-05 MED ORDER — LIDOCAINE HCL 1 % IJ SOLN
0.5000 mL | INTRAMUSCULAR | Status: AC | PRN
  Administered 2019-01-05: .5 mL

## 2019-01-05 MED ORDER — SODIUM HYALURONATE (VISCOSUP) 20 MG/2ML IX SOSY
20.0000 mg | PREFILLED_SYRINGE | INTRA_ARTICULAR | Status: AC | PRN
  Administered 2019-01-05: 20 mg via INTRA_ARTICULAR

## 2019-01-05 NOTE — Progress Notes (Signed)
   Procedure Note  Patient: [name] [name]             Date of Birth: Mar 28, 1961           MRN: [id number]             Visit Date: 01/05/2019  HPI: Mrs. Rammel is well-known to Dr. Trevor Mace service comes in today for right knee injection.  She has known osteoarthritis of the right knee.  She states knee is been more likely with the current rainy weather.  She is had no new injury.  Physical exam: Right knee she has good range of motion of the knee.  No abnormal warmth or erythema.  Slight effusion.  Procedures: Visit Diagnoses: Primary osteoarthritis of right knee  Large Joint Inj: R knee on 01/05/2019 10:54 AM Indications: pain Details: 22 G 1.5 in needle, anterolateral approach  Arthrogram: No  Medications: 20 mg Sodium Hyaluronate 20 MG/2ML; 0.5 mL lidocaine 1 % Aspirate: 25 mL yellow Outcome: tolerated well, no immediate complications Procedure, treatment alternatives, risks and benefits explained, specific risks discussed. Consent was given by the patient. Immediately prior to procedure a time out was called to verify the correct patient, procedure, equipment, support staff and site/side marked as required. Patient was prepped and draped in the usual sterile fashion.     Plan: See her back in 1 week for her second Euflexxa injection.  She tolerated the injection well today.

## 2019-01-12 ENCOUNTER — Other Ambulatory Visit: Payer: Self-pay

## 2019-01-12 ENCOUNTER — Ambulatory Visit (INDEPENDENT_AMBULATORY_CARE_PROVIDER_SITE_OTHER): Payer: 59 | Admitting: Orthopaedic Surgery

## 2019-01-12 ENCOUNTER — Encounter (INDEPENDENT_AMBULATORY_CARE_PROVIDER_SITE_OTHER): Payer: Self-pay | Admitting: Orthopaedic Surgery

## 2019-01-12 DIAGNOSIS — M1711 Unilateral primary osteoarthritis, right knee: Secondary | ICD-10-CM | POA: Diagnosis not present

## 2019-01-12 MED ORDER — SODIUM HYALURONATE (VISCOSUP) 20 MG/2ML IX SOSY
20.0000 mg | PREFILLED_SYRINGE | INTRA_ARTICULAR | Status: AC | PRN
  Administered 2019-01-12: 11:00:00 20 mg via INTRA_ARTICULAR

## 2019-01-12 NOTE — Progress Notes (Signed)
   Procedure Note  Patient: [name] [name]             Date of Birth: 06-16-1961           MRN: [id number]             Visit Date: 01/12/2019  Procedures: Visit Diagnoses: Primary osteoarthritis of right knee  Large Joint Inj: R knee on 01/12/2019 11:09 AM Indications: diagnostic evaluation and pain Details: 22 G 1.5 in needle, superolateral approach  Arthrogram: No  Medications: 20 mg Sodium Hyaluronate 20 MG/2ML Outcome: tolerated well, no immediate complications Procedure, treatment alternatives, risks and benefits explained, specific risks discussed. Consent was given by the patient. Immediately prior to procedure a time out was called to verify the correct patient, procedure, equipment, support staff and site/side marked as required. Patient was prepped and draped in the usual sterile fashion.    The patient is here today for scheduled hyaluronic acid injection in her right knee with Euflexxa.  This injection is #2 of the series of 3 injections.  We are treating the pain from osteoarthritis at the failure of other conservative treatment measures.  Given the pain she is still having this is a prudent injection to try to treat the pain from osteoarthritis.  She said no other issues as a relates injection #1.  Examination of her right knee does show mild effusion today.  She has painful joint line tenderness in the knee that is ligamentously stable.  She tolerated the injection #2 well so we will see her back next week for injection #3 of a series of 3 Euflexxa injections.

## 2019-01-19 ENCOUNTER — Encounter: Payer: Self-pay | Admitting: Orthopaedic Surgery

## 2019-01-19 ENCOUNTER — Ambulatory Visit: Payer: 59 | Admitting: Orthopaedic Surgery

## 2019-01-19 ENCOUNTER — Other Ambulatory Visit: Payer: Self-pay

## 2019-01-19 DIAGNOSIS — M1711 Unilateral primary osteoarthritis, right knee: Secondary | ICD-10-CM

## 2019-01-19 MED ORDER — SODIUM HYALURONATE (VISCOSUP) 20 MG/2ML IX SOSY
20.0000 mg | PREFILLED_SYRINGE | INTRA_ARTICULAR | Status: AC | PRN
  Administered 2019-01-19: 20 mg via INTRA_ARTICULAR

## 2019-01-19 NOTE — Progress Notes (Addendum)
   Procedure Note  Patient: [name] [name]             Date of Birth: [birth year]           MRN: [id number]             Visit Date: 01/19/2019 HPI: [name] returns today for her third and final Euflexxa injection.  States overall the knee is doing well.  She has some soreness after the aspiration and injection with the first Euflexxa injection.  Right knee: No effusion abnormal warmth erythema overall good range of motion of the knee. Procedures: Visit Diagnoses: Primary osteoarthritis of right knee - Plan: Large Joint Inj: R knee, Sodium Hyaluronate SOSY 20 mg  Large Joint Inj: R knee on 01/19/2019 3:51 PM Indications: pain Details: 22 G 1.5 in needle, superolateral approach  Arthrogram: No  Medications: 20 mg Sodium Hyaluronate 20 MG/2ML Outcome: tolerated well, no immediate complications Procedure, treatment alternatives, risks and benefits explained, specific risks discussed. Consent was given by the patient. Immediately prior to procedure a time out was called to verify the correct patient, procedure, equipment, support staff and site/side marked as required. Patient was prepped and draped in the usual sterile fashion.     Plan: She will follow-up on an as-needed basis.  She understands that she needs to wait 6 months between Euflexxa injections.  Questions encouraged and answered.  Patient did have some questions about different knee implants and this was discussed with patient at length by Dr. Ninfa Linden.

## 2019-02-03 ENCOUNTER — Other Ambulatory Visit (INDEPENDENT_AMBULATORY_CARE_PROVIDER_SITE_OTHER): Payer: Self-pay | Admitting: Physician Assistant

## 2019-03-16 ENCOUNTER — Ambulatory Visit: Payer: 59 | Admitting: Internal Medicine

## 2019-04-11 DIAGNOSIS — Z01419 Encounter for gynecological examination (general) (routine) without abnormal findings: Secondary | ICD-10-CM | POA: Diagnosis not present

## 2019-04-11 DIAGNOSIS — Z1231 Encounter for screening mammogram for malignant neoplasm of breast: Secondary | ICD-10-CM | POA: Diagnosis not present

## 2019-04-11 DIAGNOSIS — E559 Vitamin D deficiency, unspecified: Secondary | ICD-10-CM | POA: Insufficient documentation

## 2019-04-11 DIAGNOSIS — Z6826 Body mass index (BMI) 26.0-26.9, adult: Secondary | ICD-10-CM | POA: Diagnosis not present

## 2019-06-21 ENCOUNTER — Encounter: Payer: Self-pay | Admitting: Internal Medicine

## 2019-06-21 ENCOUNTER — Other Ambulatory Visit: Payer: Self-pay

## 2019-06-21 ENCOUNTER — Ambulatory Visit: Payer: 59 | Admitting: Internal Medicine

## 2019-06-21 DIAGNOSIS — J4520 Mild intermittent asthma, uncomplicated: Secondary | ICD-10-CM | POA: Diagnosis not present

## 2019-06-21 NOTE — Assessment & Plan Note (Signed)
Good control. Occasional rescue inhaler is now sufficient. Discussed meds, Covid precautions.

## 2019-06-21 NOTE — Progress Notes (Signed)
Subjective:    Patient ID: [name] [name], female    DOB: 1961/05/29, 58 y.o.   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  Ventolin HFA, Advair 100, Flonase, Zyrtec, neb albuterol Now working for Hospice with more sick contact.  Has had some colds this winter.  Used leftover prednisone taper.  Still coughing for the most recent episode but now mucus is clear.  She is getting better.  Bronchitis 1 year ago happened with fever, 1 day after she cleaned something moldy out of her refrigerator.  Not sure if this was coincidence.  Current meds are usually sufficient.  Uses rescue inhaler mainly if she has a cold.  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 meds sufficient. Hasn't been needing nebulizer. Typical seasonal allergic rhinitis- mild. Sister in [place] ICU on vent for Covid. No direct exposure to [facility].  ROS-see HPI + = positive Constitutional:   No-   weight loss, night sweats, fevers, chills, fatigue, lassitude. HEENT:   No-  headaches, difficulty swallowing, tooth/dental problems, sore throat,       No-  sneezing, itching, ear ache, nasal congestion, post nasal drip,  CV:  No-   chest pain, orthopnea, PND, swelling in lower extremities, anasarca, dizziness, palpitations Resp: No-    shortness of breath with exertion or at rest.          productive cough,  No non-productive cough,  No- coughing up of blood.             change in color of mucus.  No- wheezing.   Skin: No-   rash or lesions. GI:  No-   heartburn, indigestion, abdominal pain, nausea, vomiting, GU: . MS:  No-   joint pain or swelling.   Neuro-     nothing unusual Psych:  No- change in mood or affect. No depression or anxiety.  No memory loss.   Objective:   Physical Exam General- Alert, Oriented, Affect-appropriate, Distress- none acute   Skin- rash-none, lesions- none, excoriation- none Lymphadenopathy- none Head- atraumatic            Eyes- Gross vision intact, PERRLA, conjunctivae clear secretions            Ears- Hearing, canals normal            Nose- Clear, Mild- Septal dev, narrower on left,    No- mucus, polyps, erosion, perforation             Throat- Mallampati II , mucosa clear , drainage- none, tonsils- atrophic Neck- flexible , trachea midline, no stridor , thyroid nl, carotid no bruit Chest - symmetrical excursion , unlabored           Heart/CV- RRR , no murmur , no gallop  , no rub, nl s1 s2                           - JVD- none , edema-  none, stasis changes- none, varices- none           Lung- clear to P&A, wheeze- none, cough- none, dullness-none, rub- none           Chest wall-  Abd-  Br/ Gen/ Rectal- Not done, not indicated Extrem- cyanosis- none, clubbing, none, atrophy- none, strength- nl Neuro- grossly intact to observation   Assessment & Plan:

## 2019-06-21 NOTE — Patient Instructions (Signed)
We can continue current meds  Please call if we can help 

## 2019-06-23 DIAGNOSIS — Z23 Encounter for immunization: Secondary | ICD-10-CM | POA: Diagnosis not present

## 2019-06-27 DIAGNOSIS — I480 Paroxysmal atrial fibrillation: Secondary | ICD-10-CM | POA: Diagnosis not present

## 2019-07-27 ENCOUNTER — Ambulatory Visit: Payer: 59 | Admitting: Podiatry

## 2019-07-27 DIAGNOSIS — R5383 Other fatigue: Secondary | ICD-10-CM | POA: Diagnosis not present

## 2019-07-27 DIAGNOSIS — Z03818 Encounter for observation for suspected exposure to other biological agents ruled out: Secondary | ICD-10-CM | POA: Diagnosis not present

## 2019-07-27 DIAGNOSIS — R05 Cough: Secondary | ICD-10-CM | POA: Diagnosis not present

## 2019-07-27 DIAGNOSIS — J029 Acute pharyngitis, unspecified: Secondary | ICD-10-CM | POA: Diagnosis not present

## 2019-08-07 ENCOUNTER — Ambulatory Visit: Payer: 59 | Admitting: Podiatry

## 2019-08-08 ENCOUNTER — Encounter: Payer: Self-pay | Admitting: Sports Medicine

## 2019-08-08 ENCOUNTER — Other Ambulatory Visit: Payer: Self-pay

## 2019-08-08 ENCOUNTER — Telehealth: Payer: Self-pay

## 2019-08-08 ENCOUNTER — Ambulatory Visit: Payer: 59

## 2019-08-08 ENCOUNTER — Ambulatory Visit (INDEPENDENT_AMBULATORY_CARE_PROVIDER_SITE_OTHER): Payer: 59 | Admitting: Sports Medicine

## 2019-08-08 VITALS — BP 146/83

## 2019-08-08 DIAGNOSIS — M722 Plantar fascial fibromatosis: Secondary | ICD-10-CM

## 2019-08-08 DIAGNOSIS — M19071 Primary osteoarthritis, right ankle and foot: Secondary | ICD-10-CM | POA: Diagnosis not present

## 2019-08-08 DIAGNOSIS — M79671 Pain in right foot: Secondary | ICD-10-CM

## 2019-08-08 DIAGNOSIS — M779 Enthesopathy, unspecified: Secondary | ICD-10-CM | POA: Diagnosis not present

## 2019-08-08 NOTE — Telephone Encounter (Signed)
Submitted VOB for Euflexxa series, right knee.

## 2019-08-08 NOTE — Progress Notes (Signed)
Subjective: [name] [name] is a 58 y.o. female patient who presents to office for evaluation of R>L foot pain. Patient complains of progressive pain especially over the last 2 weeks but now pain is better does not have any pain but is concerned about the knots in her arches that have been present for years. Patient denies any other pedal complaints. Denies injury/trip/fall/sprain/any causative factors.   Review of Systems  All other systems reviewed and are negative.    Patient Active Problem List   Diagnosis Date Noted  . Asthma, mild intermittent 06/21/2019  . Primary osteoarthritis of right knee 01/15/2017  . Hx of abnormal Pap smear 01/15/2012  . Perimenopausal vasomotor symptoms 01/15/2012  . S/P hysterectomy 01/15/2012  . OTITIS MEDIA, RIGHT 04/26/2008  . Seasonal allergic rhinitis 04/26/2008  . Asthmatic bronchitis with acute exacerbation 04/26/2008  . COLITIS 04/26/2008    Current Outpatient Medications on File Prior to Visit  Medication Sig Dispense Refill  . albuterol (PROVENTIL) (2.5 MG/3ML) 0.083% nebulizer solution Take 3 mLs (2.5 mg total) by nebulization every 6 (six) hours as needed for wheezing or shortness of breath. 75 mL 12  . albuterol (VENTOLIN HFA) 108 (90 Base) MCG/ACT inhaler INHALE TWO PUFFS INTO THE LUNGS EVERY 6 HOURS AS NEEDED FOR WHEEZING OR SHORTNESS OF BREATH. 18 Inhaler 12  . aspirin EC 81 MG tablet Take by mouth.    . Calcium Carbonate-Vit D-Min 600-400 MG-UNIT TABS Take 1 tablet by mouth daily.      . cetirizine (ZYRTEC) 10 MG tablet Take 10 mg by mouth daily.      [name] estradiol (VIVELLE-DOT) 0.05 MG/24HR patch Place 1 patch onto the skin 2 (two) times a week.    . Fluticasone-Salmeterol (ADVAIR) 100-50 MCG/DOSE AEPB INHALE 1 PUFF INTO THE LUNGS TWO TIMES DAILY. RINSE MOUTH 60 each 12  . lisinopril (PRINIVIL,ZESTRIL) 10 MG tablet Take 10 mg by mouth daily.    . Misc Natural Products (PROGESTERONE) 1000 MG/60GM CREA Apply topically.      .  Multiple Vitamin (MULTIVITAMIN) capsule Take 1 capsule by mouth daily.      . nebivolol (BYSTOLIC) 10 MG tablet Take 10 mg by mouth daily.    [name] UNABLE TO FIND Med Name: Allergy/Dust mite mattress cover  Dx J30.2 and J45.901 1 Device 0   Current Facility-Administered Medications on File Prior to Visit  Medication Dose Route Frequency Provider Last Rate Last Dose  . 0.9 %  sodium chloride infusion  500 mL Intravenous Continuous Pyrtle, Lajuan Lines, MD      . diclofenac sodium (VOLTAREN) 1 % transdermal gel 2 g  2 g Topical QID Pete Pelt, PA-C        Allergies  Allergen Reactions  . Bee Venom Anaphylaxis  . Amlodipine     Feet swelled  . Benicar [Olmesartan]     diarrhea  . Codeine     REACTION: nausea  . Flagyl [Metronidazole]     Nausea and vomiting  . Hctz [Hydrochlorothiazide]     itching    Objective:  General: Alert and oriented x3 in no acute distress  Dermatology: No open lesions bilateral lower extremities, Raised soft tissue mass measures 1x1cm on right and 1.2x1cm on left in arch consistent with fibroma, no webspace macerations, no ecchymosis bilateral, all nails x 10 are well manicured.  Vascular: Dorsalis Pedis and Posterior Tibial pedal pulses palpable, Capillary Fill Time 3 seconds,(+) pedal hair growth bilateral, no edema bilateral lower extremities, Temperature gradient within normal limits.  Neurology: Gross sensation intact via light touch bilateral.   Musculoskeletal: No reproducible tenderness on right. Previous tenderness was at dorsal bone spur on right. Strength within normal limits in all groups bilateral.   Assessment and Plan: Problem List Items Addressed This Visit    None    Visit Diagnoses    Foot pain, right    -  Primary   Fibromatosis, plantar       Tendonitis       Arthritis of right foot       Relevant Medications   aspirin EC 81 MG tablet       -Complete examination performed -Xrays were NOT working  -Discussed treatmeent  options -Rx topical verapmil to use on fibromas bilateral -Continue with topical pain cream to top of right foot and Advvisl PRN -Advised gentle stretching  -Advised good supportive shoes  -Patient to return to office as needed or sooner if condition worsens.  Landis Martins, DPM

## 2019-08-24 ENCOUNTER — Telehealth: Payer: Self-pay

## 2019-08-24 NOTE — Telephone Encounter (Signed)
PA required for Euflexxa, right knee. Faxed office notes to [employer] to start authorization process. Patient does have a previous approval for Euflexxa, right knee.

## 2019-08-28 ENCOUNTER — Telehealth: Payer: Self-pay

## 2019-08-28 NOTE — Telephone Encounter (Signed)
Faxed completed PA form to [place] at [phone]. PA Required for Euflexxa sereis, right knee.

## 2019-08-31 ENCOUNTER — Other Ambulatory Visit: Payer: Self-pay | Admitting: Internal Medicine

## 2019-09-19 ENCOUNTER — Telehealth: Payer: Self-pay

## 2019-09-19 NOTE — Telephone Encounter (Signed)
Per Case Notes on [facility] portal, Patient is approved for Euflexxa series, right knee. PA Approval# [id number] Effective 12/27/2018- 12/27/2019  Talked with patient and scheduled appt.for Euflexxa series, right knee. Buy & Bill Covered at 100% of the allowable amount Co-pay of $40.00   Appt. 09/21/2019 with Erskine Emery

## 2019-09-21 ENCOUNTER — Encounter: Payer: Self-pay | Admitting: Physician Assistant

## 2019-09-21 ENCOUNTER — Other Ambulatory Visit: Payer: Self-pay

## 2019-09-21 ENCOUNTER — Ambulatory Visit: Payer: 59 | Admitting: Physician Assistant

## 2019-09-21 DIAGNOSIS — M1711 Unilateral primary osteoarthritis, right knee: Secondary | ICD-10-CM | POA: Diagnosis not present

## 2019-09-21 MED ORDER — SODIUM HYALURONATE (VISCOSUP) 20 MG/2ML IX SOSY
20.0000 mg | PREFILLED_SYRINGE | INTRA_ARTICULAR | Status: AC | PRN
  Administered 2019-09-21: 20 mg via INTRA_ARTICULAR

## 2019-09-21 NOTE — Progress Notes (Signed)
   Procedure Note  Patient: [name] [name]             Date of Birth: [birth year]           MRN: [id number]             Visit Date: 09/21/2019  HPI: [name] [name] is well-known to our department service has known osteoarthritis right knee.  She comes in today for Euflexxa injection right knee.  She states the previous injections have been very helpful.  She is not having significant pain in the knee at this point time.  Physical exam: Right knee no obvious effusion abnormal warmth or erythema.  Good range of motion the right knee.  Procedures: Visit Diagnoses:  1. Primary osteoarthritis of right knee     Large Joint Inj: R knee on 09/21/2019 4:17 PM Indications: pain Details: 22 G 1.5 in needle, anterolateral approach  Arthrogram: No  Medications: 20 mg Sodium Hyaluronate 20 MG/2ML Outcome: tolerated well, no immediate complications Procedure, treatment alternatives, risks and benefits explained, specific risks discussed. Consent was given by the patient. Immediately prior to procedure a time out was called to verify the correct patient, procedure, equipment, support staff and site/side marked as required. Patient was prepped and draped in the usual sterile fashion.     Plan: We will see her back in 1 week for the second Euflexxa injection.  Questions encouraged and answered.

## 2019-09-28 ENCOUNTER — Ambulatory Visit: Payer: 59 | Admitting: Physician Assistant

## 2019-09-28 ENCOUNTER — Other Ambulatory Visit: Payer: Self-pay

## 2019-09-28 ENCOUNTER — Encounter: Payer: Self-pay | Admitting: Physician Assistant

## 2019-09-28 DIAGNOSIS — M1711 Unilateral primary osteoarthritis, right knee: Secondary | ICD-10-CM | POA: Diagnosis not present

## 2019-09-28 MED ORDER — HYALURONAN 88 MG/4ML IX SOSY
88.0000 mg | PREFILLED_SYRINGE | INTRA_ARTICULAR | Status: AC | PRN
  Administered 2019-09-28: 88 mg via INTRA_ARTICULAR

## 2019-09-28 MED ORDER — SODIUM HYALURONATE (VISCOSUP) 20 MG/2ML IX SOSY
20.0000 mg | PREFILLED_SYRINGE | INTRA_ARTICULAR | Status: AC | PRN
  Administered 2019-09-28: 20 mg via INTRA_ARTICULAR

## 2019-09-28 NOTE — Progress Notes (Signed)
   Procedure Note  Patient: [name] [name]             Date of Birth: 1961/05/10           MRN: [id number]             Visit Date: 09/28/2019   HPI: [name] [name] returns today for second Euflexxa injection.  She is overall doing well has no complaints with regards to the right knee.    Physical exam: Right knee full extension full flexion.  No evidence of warmth erythema or effusion.  Procedures: Visit Diagnoses:  1. Primary osteoarthritis of right knee     Large Joint Inj: R knee on 09/28/2019 1:50 PM Indications: pain Details: 22 G 1.5 in needle, anterolateral approach  Arthrogram: No  Medications: 88 mg Hyaluronan 88 MG/4ML; 20 mg Sodium Hyaluronate 20 MG/2ML Outcome: tolerated well, no immediate complications Procedure, treatment alternatives, risks and benefits explained, specific risks discussed. Consent was given by the patient. Immediately prior to procedure a time out was called to verify the correct patient, procedure, equipment, support staff and site/side marked as required. Patient was prepped and draped in the usual sterile fashion.    Plan: She will follow-up with [place] in 1 week for third Euflexxa injection.

## 2019-10-05 ENCOUNTER — Other Ambulatory Visit: Payer: Self-pay

## 2019-10-05 ENCOUNTER — Encounter: Payer: Self-pay | Admitting: Physician Assistant

## 2019-10-05 ENCOUNTER — Ambulatory Visit: Payer: 59 | Admitting: Physician Assistant

## 2019-10-05 DIAGNOSIS — M1711 Unilateral primary osteoarthritis, right knee: Secondary | ICD-10-CM

## 2019-10-05 MED ORDER — SODIUM HYALURONATE (VISCOSUP) 20 MG/2ML IX SOSY
20.0000 mg | PREFILLED_SYRINGE | INTRA_ARTICULAR | Status: AC | PRN
  Administered 2019-10-05: 15:00:00 20 mg via INTRA_ARTICULAR

## 2019-10-05 NOTE — Progress Notes (Signed)
   Procedure Note  Patient: [name] [name]             Date of Birth: May 27, 1961           MRN: [id number]             Visit Date: 10/05/2019 HPI: [name] [name] returns today for her third Euflexxa injection.  She states overall the knee is doing well.  She is having no pain.  She has had no adverse effects to the injections.  Physical exam: Right knee no abnormal warmth erythema or effusion.   Procedures: Visit Diagnoses:  1. Primary osteoarthritis of right knee     Large Joint Inj: R knee on 10/05/2019 2:42 PM Indications: pain Details: 22 G 1.5 in needle, superolateral approach  Arthrogram: No  Medications: 20 mg Sodium Hyaluronate 20 MG/2ML Outcome: tolerated well, no immediate complications Procedure, treatment alternatives, risks and benefits explained, specific risks discussed. Consent was given by the patient. Immediately prior to procedure a time out was called to verify the correct patient, procedure, equipment, support staff and site/side marked as required. Patient was prepped and draped in the usual sterile fashion.     Plan: She will follow-up with [place] on as-needed basis.  She understands she needs to wait at least 6 months between injections.  Continue work on Forensic scientist.

## 2020-02-09 DIAGNOSIS — M25561 Pain in right knee: Secondary | ICD-10-CM | POA: Insufficient documentation

## 2020-04-04 ENCOUNTER — Telehealth: Payer: Self-pay

## 2020-04-04 NOTE — Telephone Encounter (Signed)
Noted.   Will submit for Euflexxa, right knee.

## 2020-04-05 ENCOUNTER — Telehealth: Payer: Self-pay

## 2020-04-05 NOTE — Telephone Encounter (Signed)
Submitted VOB, Euflexxa series, right knee.

## 2020-04-12 ENCOUNTER — Telehealth: Payer: Self-pay

## 2020-04-12 NOTE — Telephone Encounter (Signed)
PA required for Euflexxa, right knee. Faxed office notes to [employer] to start prior authorization.

## 2020-04-16 ENCOUNTER — Telehealth: Payer: Self-pay | Admitting: Orthopaedic Surgery

## 2020-04-16 NOTE — Telephone Encounter (Signed)
Can we order this please

## 2020-04-16 NOTE — Telephone Encounter (Signed)
Pt called stating she would like to get another injection her R knee but believes the shot has to be ordered and approved first. Pt states the name of the injection is eufflex

## 2020-04-18 NOTE — Telephone Encounter (Signed)
Can you please help with this ?

## 2020-04-18 NOTE — Telephone Encounter (Signed)
Prior auth form completed and faxed today  Pt informed and stated understanding of status

## 2020-04-18 NOTE — Telephone Encounter (Signed)
April faxed office notes Friday for PA

## 2020-04-26 ENCOUNTER — Telehealth: Payer: Self-pay

## 2020-04-26 NOTE — Telephone Encounter (Signed)
Height and Weight needed to complete process for PA.  Faxed to [phone].

## 2020-04-30 ENCOUNTER — Telehealth: Payer: Self-pay

## 2020-04-30 NOTE — Telephone Encounter (Signed)
Per [facility] [employer] portal, PA is currently pending and under review after receiving additional information to proceed.

## 2020-05-03 ENCOUNTER — Telehealth: Payer: Self-pay

## 2020-05-03 NOTE — Telephone Encounter (Signed)
PA was denied for Euflexxa, right knee.  Submitted VOB, Orthovisc, right knee due to Euflexxa not being a preferred product for patient's insurance.

## 2020-05-08 ENCOUNTER — Telehealth: Payer: Self-pay

## 2020-05-08 NOTE — Telephone Encounter (Signed)
PA required for Orthovisc, right knee. Faxed completed PA form to [place] at [phone].

## 2020-05-10 ENCOUNTER — Telehealth: Payer: Self-pay

## 2020-05-10 NOTE — Telephone Encounter (Signed)
Approved, Orthovisc, right knee. [place] Patient is covered through her insurance. Co-pay of $50.00 per date of service No PA required  Appt. 05/16/2020

## 2020-05-16 ENCOUNTER — Encounter: Payer: Self-pay | Admitting: Physician Assistant

## 2020-05-16 ENCOUNTER — Ambulatory Visit: Payer: 59 | Admitting: Physician Assistant

## 2020-05-16 DIAGNOSIS — M1711 Unilateral primary osteoarthritis, right knee: Secondary | ICD-10-CM

## 2020-05-16 MED ORDER — HYALURONAN 88 MG/4ML IX SOSY
88.0000 mg | PREFILLED_SYRINGE | INTRA_ARTICULAR | Status: AC | PRN
  Administered 2020-05-16: 88 mg via INTRA_ARTICULAR

## 2020-05-16 NOTE — Progress Notes (Signed)
   Procedure Note  Patient: [name] [name]             Date of Birth: [birth year]           MRN: [id number]             Visit Date: 05/16/2020  HPI: [name] [name] is well-known to our department service has known osteoarthritis right knee.  She comes in today for Orthovisc injection of her right knee.  She currently having no pain in the knee.  She does have some pain when going downhill or with weather changes.  She states the last set of supplemental injections was very helpful.  Physical exam: Right knee good range of motion.  No effusion, erythema or ecchymosis.  Procedures: Visit Diagnoses:  1. Primary osteoarthritis of right knee     Large Joint Inj on 05/16/2020 4:36 PM Indications: pain Details: 22 G 1.5 in needle, superolateral approach  Arthrogram: No  Medications: 88 mg Hyaluronan 88 MG/4ML Outcome: tolerated well, no immediate complications Procedure, treatment alternatives, risks and benefits explained, specific risks discussed. Consent was given by the patient. Immediately prior to procedure a time out was called to verify the correct patient, procedure, equipment, support staff and site/side marked as required. Patient was prepped and draped in the usual sterile fashion.     Plan: We will see her back in 1 week for her second of three Orthovisc injections.  Questions were encouraged and answered.

## 2020-05-23 ENCOUNTER — Ambulatory Visit: Payer: 59 | Admitting: Physician Assistant

## 2020-05-23 ENCOUNTER — Encounter: Payer: Self-pay | Admitting: Physician Assistant

## 2020-05-23 DIAGNOSIS — M1711 Unilateral primary osteoarthritis, right knee: Secondary | ICD-10-CM | POA: Diagnosis not present

## 2020-05-23 MED ORDER — HYALURONAN 30 MG/2ML IX SOSY
30.0000 mg | PREFILLED_SYRINGE | INTRA_ARTICULAR | Status: AC | PRN
  Administered 2020-05-23: 30 mg via INTRA_ARTICULAR

## 2020-05-23 NOTE — Progress Notes (Signed)
   Procedure Note  Patient: Farrel Gordon             Date of Birth: 08/05/1961           MRN: [id number]             Visit Date: 05/23/2020 HPI: Mrs. Bartling returns today follow-up status post first of 3 Orthovisc injections.  She states the first injection did well.  She has had some pain with barometric changes.  No adverse effects.  Physical exam: Right knee no abnormal warmth erythema or effusion.  Slight edema.  Good range of motion.  Procedures: Visit Diagnoses:  1. Primary osteoarthritis of right knee     Large Joint Inj on 05/23/2020 3:25 PM Indications: pain Details: 22 G 1.5 in needle, anterolateral approach  Arthrogram: No  Medications: 30 mg Hyaluronan 30 MG/2ML Outcome: tolerated well, no immediate complications Procedure, treatment alternatives, risks and benefits explained, specific risks discussed. Consent was given by the patient. Immediately prior to procedure a time out was called to verify the correct patient, procedure, equipment, support staff and site/side marked as required. Patient was prepped and draped in the usual sterile fashion.     Plan: She will follow up with [place] in 1 week for third 3 Orthovisc injections.  Questions were encouraged and answered

## 2020-05-28 DIAGNOSIS — Z1231 Encounter for screening mammogram for malignant neoplasm of breast: Secondary | ICD-10-CM | POA: Diagnosis not present

## 2020-05-28 DIAGNOSIS — Z8679 Personal history of other diseases of the circulatory system: Secondary | ICD-10-CM | POA: Insufficient documentation

## 2020-05-28 DIAGNOSIS — Z01419 Encounter for gynecological examination (general) (routine) without abnormal findings: Secondary | ICD-10-CM | POA: Diagnosis not present

## 2020-05-30 ENCOUNTER — Ambulatory Visit: Payer: 59 | Admitting: Physician Assistant

## 2020-05-30 ENCOUNTER — Encounter: Payer: Self-pay | Admitting: Physician Assistant

## 2020-05-30 ENCOUNTER — Other Ambulatory Visit: Payer: Self-pay

## 2020-05-30 DIAGNOSIS — M1711 Unilateral primary osteoarthritis, right knee: Secondary | ICD-10-CM

## 2020-05-30 MED ORDER — CROSS-LINK HYAL ACID (VISC) 30 MG/3ML IX PRSY
30.0000 mg | PREFILLED_SYRINGE | INTRA_ARTICULAR | Status: AC | PRN
  Administered 2020-05-30: 30 mg via INTRA_ARTICULAR

## 2020-05-30 NOTE — Progress Notes (Signed)
° °  Procedure Note  Patient: [name] [name]             Date of Birth: 1960/10/16           MRN: [id number]             Visit Date: 05/30/2020 HPI: [name] returns today for her third gel 1 injection.  She states the knee is overall doing well.  She does feel that the injections are helping thus far.  No new injury.  Physical exam: Right knee no abnormal warmth erythema or effusion. Procedures: Visit Diagnoses:  1. Primary osteoarthritis of right knee     Large Joint Inj: R knee on 05/30/2020 3:31 PM Indications: pain Details: 22 G 1.5 in needle, anterolateral approach  Arthrogram: No  Medications: 30 mg Cross-Linked Hyaluronate 30 MG/3ML Outcome: tolerated well, no immediate complications Procedure, treatment alternatives, risks and benefits explained, specific risks discussed. Consent was given by the patient. Immediately prior to procedure a time out was called to verify the correct patient, procedure, equipment, support staff and site/side marked as required. Patient was prepped and draped in the usual sterile fashion.    Plan: We will see her back as needed.  She knows to wait at least 6 months between injections.  Questions were encouraged and answered.

## 2020-06-12 ENCOUNTER — Ambulatory Visit (INDEPENDENT_AMBULATORY_CARE_PROVIDER_SITE_OTHER): Payer: 59 | Admitting: Physician Assistant

## 2020-06-12 ENCOUNTER — Encounter: Payer: Self-pay | Admitting: Physician Assistant

## 2020-06-12 DIAGNOSIS — M1711 Unilateral primary osteoarthritis, right knee: Secondary | ICD-10-CM

## 2020-06-12 MED ORDER — LIDOCAINE HCL 1 % IJ SOLN
3.0000 mL | INTRAMUSCULAR | Status: AC | PRN
  Administered 2020-06-12: 3 mL

## 2020-06-12 MED ORDER — METHYLPREDNISOLONE ACETATE 40 MG/ML IJ SUSP
40.0000 mg | INTRAMUSCULAR | Status: AC | PRN
  Administered 2020-06-12: 40 mg via INTRA_ARTICULAR

## 2020-06-12 NOTE — Progress Notes (Signed)
   Procedure Note  Patient: Farrel Gordon             Date of Birth: [birth year]           MRN: [id number]             Visit Date: 06/12/2020 HPI: Mrs. Kulak returns today due to increasing right knee pain status post series of Orthovisc injection.  States her pain is worse in the prior to the injection.  She notes she is limping now.  She is done well with previous Euflexxa injections for years without any adverse effects.  Right knee: No abnormal warmth erythema or effusion.  Good range of motion of the knee.  Procedures: Visit Diagnoses:  1. Primary osteoarthritis of right knee     Large Joint Inj on 06/12/2020 4:29 PM Indications: pain Details: 22 G 1.5 in needle, anterolateral approach  Arthrogram: No  Medications: 3 mL lidocaine 1 %; 40 mg methylPREDNISolone acetate 40 MG/ML Outcome: tolerated well, no immediate complications Procedure, treatment alternatives, risks and benefits explained, specific risks discussed. Consent was given by the patient. Immediately prior to procedure a time out was called to verify the correct patient, procedure, equipment, support staff and site/side marked as required. Patient was prepped and draped in the usual sterile fashion.     Plan: She will follow up with [place] as needed.  95 petition for Euflexxa in the future due to the fact that she has had good results with the Euflexxa for years without adverse effects.  Questions were encouraged and answered at length.  She will continue to take Advil for pain.

## 2020-06-20 ENCOUNTER — Encounter: Payer: Self-pay | Admitting: Internal Medicine

## 2020-06-20 ENCOUNTER — Other Ambulatory Visit: Payer: Self-pay

## 2020-06-20 ENCOUNTER — Ambulatory Visit: Payer: 59 | Admitting: Internal Medicine

## 2020-06-20 DIAGNOSIS — J301 Allergic rhinitis due to pollen: Secondary | ICD-10-CM | POA: Diagnosis not present

## 2020-06-20 DIAGNOSIS — J4520 Mild intermittent asthma, uncomplicated: Secondary | ICD-10-CM

## 2020-06-20 MED ORDER — PREDNISONE 10 MG PO TABS: ORAL_TABLET | ORAL | 0 refills | Status: DC

## 2020-06-20 MED ORDER — AZITHROMYCIN 250 MG PO TABS: ORAL_TABLET | ORAL | 0 refills | Status: DC

## 2020-06-20 NOTE — Patient Instructions (Signed)
Scripts sent for Zpak and prednisone to carry on your trip- have fun  [place] to continue current asthma meds  Good luck with your vaccine plans  Please call if we can help

## 2020-06-20 NOTE — Progress Notes (Signed)
Subjective:    Patient ID: [name] [name], female    DOB: December 07, 1960, 59 y.o.   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 meds sufficient. Hasn't been needing nebulizer. Typical seasonal allergic rhinitis- mild. Sister in [place] ICU on vent for Covid. No direct exposure to [facility].  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 No exacerbation. Did have an epsiode of palpitation- worked up and no on BASA.  ROS-see HPI + = positive Constitutional:   No-   weight loss, night sweats, fevers, chills, fatigue, lassitude. HEENT:   No-  headaches, difficulty swallowing, tooth/dental problems, sore throat,       No-  sneezing, itching, ear ache, nasal congestion, post nasal drip,  CV:  No-   chest pain, orthopnea, PND, swelling in lower extremities, anasarca, dizziness, +palpitations Resp: No-   shortness of breath with exertion or at rest.          productive cough,  No non-productive cough,  No- coughing up of blood.             change in color of mucus.  No- wheezing.   Skin: No-   rash or lesions. GI:  No-   heartburn, indigestion, abdominal pain, nausea, vomiting, GU: . MS:  No-   joint pain or swelling.   Neuro-     nothing unusual Psych:  No- change in mood or affect. No  depression or anxiety.  No memory loss.   Objective:   Physical Exam General- Alert, Oriented, Affect-appropriate, Distress- none acute   Skin- rash-none, lesions- none, excoriation- none Lymphadenopathy- none Head- atraumatic            Eyes- Gross vision intact, PERRLA, conjunctivae clear secretions            Ears- Hearing, canals normal            Nose- Clear, Mild- Septal dev, narrower on left,    No- mucus, polyps, erosion, perforation             Throat- Mallampati II , mucosa clear , drainage- none, tonsils- atrophic Neck- flexible , trachea midline, no stridor , thyroid nl, carotid no bruit Chest - symmetrical excursion , unlabored           Heart/CV- RRR , no murmur , no gallop  , no rub, nl s1 s2                           - JVD- none , edema- none, stasis changes- none, varices- none           Lung- clear to P&A, wheeze- none, cough- none, dullness-none, rub- none           Chest wall-  Abd-  Br/ Gen/ Rectal- Not done, not indicated Extrem- cyanosis- none, clubbing, none, atrophy- none, strength- nl Neuro- grossly intact to observation   Assessment &  Plan:

## 2020-06-22 ENCOUNTER — Encounter: Payer: Self-pay | Admitting: Internal Medicine

## 2020-06-22 NOTE — Assessment & Plan Note (Signed)
Remains mild intermittent uncomplicated. Plan - continue current meds. Agreed to provide prednisone taper and Zpak for her to hold in case needed this winter.

## 2020-06-22 NOTE — Assessment & Plan Note (Signed)
No significant issues so far this Fall. Can treat otc if needeed

## 2020-07-08 DIAGNOSIS — M1711 Unilateral primary osteoarthritis, right knee: Secondary | ICD-10-CM | POA: Diagnosis not present

## 2020-08-05 ENCOUNTER — Other Ambulatory Visit: Payer: Self-pay | Admitting: Internal Medicine

## 2020-08-05 NOTE — Telephone Encounter (Signed)
Pt needs refill on PREDNISONE 10MG  TAB next ov 07/05/20

## 2020-08-05 NOTE — Telephone Encounter (Signed)
Prednisone taper script sent

## 2020-08-29 ENCOUNTER — Encounter: Payer: Self-pay | Admitting: Physician Assistant

## 2020-08-29 ENCOUNTER — Ambulatory Visit: Payer: 59 | Admitting: Physician Assistant

## 2020-08-29 DIAGNOSIS — M1711 Unilateral primary osteoarthritis, right knee: Secondary | ICD-10-CM

## 2020-08-29 MED ORDER — METHYLPREDNISOLONE ACETATE 40 MG/ML IJ SUSP
40.0000 mg | INTRAMUSCULAR | Status: AC | PRN
Start: 2020-08-29 — End: 2020-08-29
  Administered 2020-08-29: 40 mg via INTRA_ARTICULAR

## 2020-08-29 MED ORDER — LIDOCAINE HCL 1 % IJ SOLN
3.0000 mL | INTRAMUSCULAR | Status: AC | PRN
Start: 2020-08-29 — End: 2020-08-29
  Administered 2020-08-29: 3 mL

## 2020-08-29 NOTE — Progress Notes (Signed)
   Procedure Note  Patient: [name] [name]             Date of Birth: 02/04/1961           MRN: [id number]             Visit Date: 08/29/2020 HPI: Ms. Breeland returns today requesting injection in her right knee.  She last had injection 06/12/2020.  States she did well until last couple weeks she has had no new injury.  She states the pain is slowly coming back.  Again she did well for years doing Euflexxa injections however the last supplemental injection was Orthovisc and she has not had as good result with this.  She understands it is a little early to have the cortisone injection today but she is nondiabetic.  She denies any recent fevers chills infections or recent vaccines.  Physical exam: Right knee good range of motion.  No abnormal warmth erythema or effusion.  Procedures: Visit Diagnoses:  1. Primary osteoarthritis of right knee     Large Joint Inj on 08/29/2020 3:50 PM Indications: pain Details: 22 G 1.5 in needle, anterolateral approach  Arthrogram: No  Medications: 3 mL lidocaine 1 %; 40 mg methylPREDNISolone acetate 40 MG/ML Outcome: tolerated well, no immediate complications Procedure, treatment alternatives, risks and benefits explained, specific risks discussed. Consent was given by the patient. Immediately prior to procedure a time out was called to verify the correct patient, procedure, equipment, support staff and site/side marked as required. Patient was prepped and draped in the usual sterile fashion.     Plan: We will see her back sometime in March hopefully at that time she will be approved for Euflexxa injection.  Would not recommend another cortisone injection for at least 3 to 4 months.  Questions encouraged and answered point

## 2020-09-09 ENCOUNTER — Other Ambulatory Visit: Payer: Self-pay | Admitting: Internal Medicine

## 2020-11-18 ENCOUNTER — Telehealth: Payer: Self-pay

## 2020-11-18 NOTE — Telephone Encounter (Signed)
Submitted for VOB for Euflexxa-right knee

## 2020-11-19 ENCOUNTER — Telehealth: Payer: Self-pay

## 2020-11-19 NOTE — Telephone Encounter (Signed)
Pt's insurance requires a prior [place]. This was completed and faxed to insurance

## 2020-11-25 ENCOUNTER — Telehealth: Payer: Self-pay

## 2020-11-25 NOTE — Telephone Encounter (Signed)
patient called she would like to get pre approval process stated for a gel injection, the last injection receive was 05/30/2020 call [phone]

## 2020-11-25 NOTE — Telephone Encounter (Signed)
Can we start a new one please

## 2020-11-25 NOTE — Telephone Encounter (Signed)
Noted  

## 2020-11-29 ENCOUNTER — Telehealth: Payer: Self-pay

## 2020-11-29 NOTE — Telephone Encounter (Signed)
Approved for Euflexxa-right knee Buy and bill $50 copay Covered @ 100% auth # [id number]   Pt called and scheduled for monday

## 2020-12-02 ENCOUNTER — Encounter: Payer: Self-pay | Admitting: Physician Assistant

## 2020-12-02 ENCOUNTER — Ambulatory Visit (INDEPENDENT_AMBULATORY_CARE_PROVIDER_SITE_OTHER): Payer: 59 | Admitting: Physician Assistant

## 2020-12-02 DIAGNOSIS — M1711 Unilateral primary osteoarthritis, right knee: Secondary | ICD-10-CM | POA: Diagnosis not present

## 2020-12-02 MED ORDER — SODIUM HYALURONATE (VISCOSUP) 20 MG/2ML IX SOSY
20.0000 mg | PREFILLED_SYRINGE | INTRA_ARTICULAR | Status: AC | PRN
  Administered 2020-12-02: 20 mg via INTRA_ARTICULAR

## 2020-12-02 NOTE — Progress Notes (Signed)
   Procedure Note  Patient: [name] [name]             Date of Birth: 28-Oct-1960           MRN: [id number]             Visit Date: 12/02/2020 Selicia is diagnosed with osteoarthritis of the right knee.    MRI show evidence of severe lateral compartment joint osteoarthritis  This patient has knee pain which interferes with functional and activities of daily living.    This patient has experienced inadequate response, adverse effects and/or intolerance with conservative treatments such as acetaminophen, NSAIDS, topical creams, physical therapy or regular exercise, knee bracing and/or weight loss.   This patient has experienced inadequate response or has a contraindication to intra articular steroid injections for at least 3 months.   This patient is not scheduled to have a total knee replacement within 6 months of starting treatment with viscosupplementation.  Physical exam: Right knee good range of motion.  No abnormal warmth erythema or effusion.  Procedures: Visit Diagnoses:  1. Primary osteoarthritis of right knee     Large Joint Inj: R knee on 12/02/2020 8:32 AM Indications: pain Details: 22 G 1.5 in needle, superolateral approach  Arthrogram: No  Medications: 20 mg Sodium Hyaluronate 20 MG/2ML Outcome: tolerated well, no immediate complications Procedure, treatment alternatives, risks and benefits explained, specific risks discussed. Consent was given by the patient. Immediately prior to procedure a time out was called to verify the correct patient, procedure, equipment, support staff and site/side marked as required. Patient was prepped and draped in the usual sterile fashion.     Plan: We will see her back in 1 week for her second of 3 Euflexxa injections.  Questions encouraged and answered.

## 2020-12-09 ENCOUNTER — Encounter: Payer: Self-pay | Admitting: Physician Assistant

## 2020-12-09 ENCOUNTER — Ambulatory Visit: Payer: 59 | Admitting: Physician Assistant

## 2020-12-09 DIAGNOSIS — M1711 Unilateral primary osteoarthritis, right knee: Secondary | ICD-10-CM | POA: Diagnosis not present

## 2020-12-09 MED ORDER — SODIUM HYALURONATE (VISCOSUP) 20 MG/2ML IX SOSY
20.0000 mg | PREFILLED_SYRINGE | INTRA_ARTICULAR | Status: AC | PRN
  Administered 2020-12-09: 20 mg via INTRA_ARTICULAR

## 2020-12-09 NOTE — Progress Notes (Signed)
   Procedure Note  Patient: [name] [name]             Date of Birth: 11-08-1960           MRN: [id number]             Visit Date: 12/09/2020   HPI: Ting returns today for her second Euflexxa injection.  She states overall she is doing well she had no adverse effects from the first Euflexxa injection.  Again she has known osteoarthritis of the right knee.  Review of systems: No fevers chills.  See HPI  Physical exam: Right knee no abnormal warmth erythema.  No effusion.   Procedures: Visit Diagnoses:  1. Primary osteoarthritis of right knee     Large Joint Inj: R knee on 12/09/2020 1:18 PM Indications: pain Details: 22 G 1.5 in needle, anterolateral approach  Arthrogram: No  Medications: 20 mg Sodium Hyaluronate 20 MG/2ML Outcome: tolerated well, no immediate complications Procedure, treatment alternatives, risks and benefits explained, specific risks discussed. Consent was given by the patient. Immediately prior to procedure a time out was called to verify the correct patient, procedure, equipment, support staff and site/side marked as required. Patient was prepped and draped in the usual sterile fashion.    Plan: We will see her back in 1 week for her third Euflexxa injection.  Questions were encouraged and answered

## 2020-12-16 ENCOUNTER — Telehealth: Payer: Self-pay

## 2020-12-16 NOTE — Telephone Encounter (Signed)
That will be fine. 

## 2020-12-16 NOTE — Telephone Encounter (Signed)
Pt called and rescheduled her apt for tomorrow. She would like to know if it will mess her injections up not doing them exactly a week a part.  She can't come in until next Monday is it okay to wait 2 weeks ?

## 2020-12-16 NOTE — Telephone Encounter (Signed)
This is fine, right?

## 2020-12-17 ENCOUNTER — Ambulatory Visit: Payer: 59 | Admitting: Orthopaedic Surgery

## 2020-12-17 NOTE — Telephone Encounter (Signed)
Patient aware this is ok  

## 2020-12-23 ENCOUNTER — Ambulatory Visit: Payer: 59 | Admitting: Physician Assistant

## 2020-12-23 ENCOUNTER — Encounter: Payer: Self-pay | Admitting: Physician Assistant

## 2020-12-23 DIAGNOSIS — M1711 Unilateral primary osteoarthritis, right knee: Secondary | ICD-10-CM

## 2020-12-23 MED ORDER — SODIUM HYALURONATE (VISCOSUP) 20 MG/2ML IX SOSY
20.0000 mg | PREFILLED_SYRINGE | INTRA_ARTICULAR | Status: AC | PRN
  Administered 2020-12-23: 20 mg via INTRA_ARTICULAR

## 2020-12-23 NOTE — Progress Notes (Signed)
   Procedure Note  Patient: [name] [name]             Date of Birth: [birth year]           MRN: [id number]             Visit Date: 12/23/2020 HPI: Ms. Petzold returns today for follow-up of her right knee said 2 of 3 Euflexxa injections.  She states knee is doing pretty well.  She has had no adverse effects to the injection.  Again she has known osteoarthritis right knee.  Physical exam: Right knee no abnormal warmth erythema or effusion.   Procedures: Visit Diagnoses:  1. Primary osteoarthritis of right knee     Large Joint Inj: R knee on 12/23/2020 2:37 PM Indications: pain Details: 22 G 1.5 in needle, superolateral approach  Arthrogram: No  Medications: 20 mg Sodium Hyaluronate 20 MG/2ML Outcome: tolerated well, no immediate complications Procedure, treatment alternatives, risks and benefits explained, specific risks discussed. Consent was given by the patient. Immediately prior to procedure a time out was called to verify the correct patient, procedure, equipment, support staff and site/side marked as required. Patient was prepped and draped in the usual sterile fashion.      Plan: Patient follow-up as needed she knows to wait at least 6 months between Euflexxa injections.  Questions encouraged and answered at length.

## 2020-12-24 ENCOUNTER — Other Ambulatory Visit: Payer: Self-pay | Admitting: Physician Assistant

## 2021-05-08 ENCOUNTER — Other Ambulatory Visit: Payer: Self-pay | Admitting: Internal Medicine

## 2021-06-23 ENCOUNTER — Telehealth: Payer: Self-pay | Admitting: Orthopaedic Surgery

## 2021-06-23 NOTE — Telephone Encounter (Signed)
Noted  

## 2021-06-23 NOTE — Telephone Encounter (Signed)
Pt called asking to get the same gel injection she got before. She states she has new insurance. Its [employer] mem ID: [id number]  CB [phone]

## 2021-07-03 ENCOUNTER — Telehealth: Payer: Self-pay

## 2021-07-03 NOTE — Telephone Encounter (Signed)
VOB submitted for Euflexxa, right knee. Pending BV. 

## 2021-07-09 ENCOUNTER — Telehealth: Payer: Self-pay

## 2021-07-09 NOTE — Telephone Encounter (Signed)
PA required for Euflexxa, right knee. PA is currently pending

## 2021-07-17 ENCOUNTER — Ambulatory Visit: Payer: Managed Care, Other (non HMO) | Admitting: Physician Assistant

## 2021-07-17 DIAGNOSIS — M1711 Unilateral primary osteoarthritis, right knee: Secondary | ICD-10-CM | POA: Diagnosis not present

## 2021-07-17 MED ORDER — SODIUM HYALURONATE (VISCOSUP) 20 MG/2ML IX SOSY
20.0000 mg | PREFILLED_SYRINGE | INTRA_ARTICULAR | Status: AC | PRN
  Administered 2021-07-17: 20 mg via INTRA_ARTICULAR

## 2021-07-17 NOTE — Progress Notes (Signed)
   Procedure Note  Patient: Farrel Gordon             Date of Birth: April 30, 1961           MRN: [id number]             Visit Date: 07/17/2021 HPI: Ms. Sapp returns today for scheduled Euflexxa injection right knee.  She states that knee overall is doing well she states she can tell it is beginning to wear off some.  She had no new injuries.  She does have known osteoarthritis of the right knee.  Physical exam: Right knee no abnormal warmth erythema or effusion.  Good range of motion of the right knee.    Procedures: Visit Diagnoses:  1. Primary osteoarthritis of right knee     Large Joint Inj: R knee on 07/17/2021 4:03 PM Indications: pain Details: 22 G 1.5 in needle, anterolateral approach  Arthrogram: No  Medications: 20 mg Sodium Hyaluronate 20 MG/2ML Outcome: tolerated well, no immediate complications Procedure, treatment alternatives, risks and benefits explained, specific risks discussed. Consent was given by the patient. Immediately prior to procedure a time out was called to verify the correct patient, procedure, equipment, support staff and site/side marked as required. Patient was prepped and draped in the usual sterile fashion.    Plan: We will see her back in 1 week for her second of 3 Euflexxa injections.  Questions were encouraged and answered.

## 2021-07-18 ENCOUNTER — Telehealth: Payer: Self-pay

## 2021-07-18 NOTE — Telephone Encounter (Signed)
PA for Euflexxa, right knee is still pending.

## 2021-07-22 ENCOUNTER — Telehealth: Payer: Self-pay

## 2021-07-22 NOTE — Telephone Encounter (Signed)
Approved for Euflexxa series, right knee. Buy & Bill Covered at 100% of the allowable amount Co-pay of $50.00 per date of service PA Approval# [id number] Valid 07/09/2021-08/13/2021

## 2021-07-23 NOTE — Progress Notes (Signed)
  Subjective:    Patient ID: [name] [name], female    DOB: 05-Jan-1961, 60 y.o.   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 No exacerbation. Did have an epsiode of palpitation- worked up and no on BASA.  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   ROS-see HPI + = positive Constitutional:   No-   weight loss, night sweats, fevers, chills, fatigue, lassitude. HEENT:   No-  headaches, difficulty swallowing, tooth/dental problems, sore throat,       No-  sneezing, itching, ear ache, nasal congestion, post nasal drip,  CV:  No-   chest pain, orthopnea, PND, swelling in lower extremities, anasarca, dizziness, +palpitations Resp: No-   shortness of breath with exertion or at rest.          productive cough,  No non-productive cough,  No- coughing up of blood.             change in color of mucus.  No- wheezing.   Skin: No-   rash or lesions. GI:  No-   heartburn, indigestion, abdominal pain, nausea, vomiting, GU: . MS:  No-   joint pain or swelling.   Neuro-     nothing unusual Psych:  No- change in mood or affect. No depression or anxiety.  No memory loss.   Objective:   Physical Exam General- Alert, Oriented, Affect-appropriate, Distress- none acute   Skin- rash-none, lesions- none, excoriation-  none Lymphadenopathy- none Head- atraumatic            Eyes- Gross vision intact, PERRLA, conjunctivae clear secretions            Ears- Hearing, canals normal            Nose- Clear, Mild- Septal dev, narrower on left,    No- mucus, polyps, erosion, perforation             Throat- Mallampati II , mucosa clear , drainage- none, tonsils- atrophic Neck- flexible , trachea midline, no stridor , thyroid nl, carotid no bruit Chest - symmetrical excursion , unlabored           Heart/CV- RRR , no murmur , no gallop  , no rub, nl s1 s2                           - JVD- none , edema- none, stasis changes- none, varices- none           Lung- clear to P&A, wheeze- none, cough- none, dullness-none, rub- none           Chest wall-  Abd-  Br/ Gen/ Rectal- Not done, not indicated Extrem- cyanosis- none, clubbing, none, atrophy- none, strength- nl Neuro- grossly intact to observation   Assessment & Plan:

## 2021-07-24 ENCOUNTER — Encounter: Payer: Self-pay | Admitting: Internal Medicine

## 2021-07-24 ENCOUNTER — Ambulatory Visit (INDEPENDENT_AMBULATORY_CARE_PROVIDER_SITE_OTHER): Payer: Managed Care, Other (non HMO) | Admitting: Internal Medicine

## 2021-07-24 ENCOUNTER — Other Ambulatory Visit: Payer: Self-pay

## 2021-07-24 ENCOUNTER — Encounter: Payer: Self-pay | Admitting: Orthopaedic Surgery

## 2021-07-24 ENCOUNTER — Ambulatory Visit: Payer: 59 | Admitting: Orthopaedic Surgery

## 2021-07-24 DIAGNOSIS — M1711 Unilateral primary osteoarthritis, right knee: Secondary | ICD-10-CM

## 2021-07-24 DIAGNOSIS — J301 Allergic rhinitis due to pollen: Secondary | ICD-10-CM | POA: Diagnosis not present

## 2021-07-24 DIAGNOSIS — J4520 Mild intermittent asthma, uncomplicated: Secondary | ICD-10-CM

## 2021-07-24 MED ORDER — FLUTICASONE-SALMETEROL 100-50 MCG/ACT IN AEPB
INHALATION_SPRAY | RESPIRATORY_TRACT | 12 refills | Status: DC
  Filled 2022-02-24: qty 60, 30d supply, fill #0

## 2021-07-24 MED ORDER — ALBUTEROL SULFATE HFA 108 (90 BASE) MCG/ACT IN AERS: INHALATION_SPRAY | RESPIRATORY_TRACT | 12 refills | Status: DC

## 2021-07-24 MED ORDER — SODIUM HYALURONATE (VISCOSUP) 20 MG/2ML IX SOSY
20.0000 mg | PREFILLED_SYRINGE | INTRA_ARTICULAR | Status: AC | PRN
Start: 2021-07-24 — End: 2021-07-24
  Administered 2021-07-24: 20 mg via INTRA_ARTICULAR

## 2021-07-24 NOTE — Assessment & Plan Note (Signed)
Continues mild uncomplicated Plan- medications discussed. Refills.

## 2021-07-24 NOTE — Progress Notes (Signed)
   Procedure Note  Patient: [name] [name]             Date of Birth: 17-Nov-1960           MRN: [id number]             Visit Date: 07/24/2021  Procedures: Visit Diagnoses:  1. Primary osteoarthritis of right knee     Large Joint Inj: R knee on 07/24/2021 1:20 PM Indications: diagnostic evaluation and pain Details: 22 G 1.5 in needle, superolateral approach  Arthrogram: No  Medications: 20 mg Sodium Hyaluronate 20 MG/2ML Outcome: tolerated well, no immediate complications Procedure, treatment alternatives, risks and benefits explained, specific risks discussed. Consent was given by the patient. Immediately prior to procedure a time out was called to verify the correct patient, procedure, equipment, support staff and site/side marked as required. Patient was prepped and draped in the usual sterile fashion.   The patient is here today for #2 of a series of 3 injections in her right knee with hyaluronic acid to treat the pain from osteoarthritis.  This is with Euflexxa.  She had no adverse reaction to the first injection.  The 7 injections have helped her greatly in the past.  She is only 60 years old and very active.  On exam there is no knee joint effusion of the right knee.  I did place Euflexxa No. 2 in the superior lateral aspect of her right knee without difficulty.  We will see her back next week for injection #3.  All questions and concerns were answered and addressed.

## 2021-07-24 NOTE — Patient Instructions (Signed)
Advair and albuterol hfa refilled  Hope you don't catch Covid or flu, but if you need our help, let us know.

## 2021-07-24 NOTE — Assessment & Plan Note (Signed)
No significant Fall seasonal flare. Has otc meds if needed.

## 2021-07-31 ENCOUNTER — Encounter: Payer: Self-pay | Admitting: Physician Assistant

## 2021-07-31 ENCOUNTER — Ambulatory Visit (INDEPENDENT_AMBULATORY_CARE_PROVIDER_SITE_OTHER): Payer: 59 | Admitting: Physician Assistant

## 2021-07-31 DIAGNOSIS — M1711 Unilateral primary osteoarthritis, right knee: Secondary | ICD-10-CM | POA: Diagnosis not present

## 2021-07-31 MED ORDER — HYALURONAN 88 MG/4ML IX SOSY
88.0000 mg | PREFILLED_SYRINGE | INTRA_ARTICULAR | Status: AC | PRN
  Administered 2021-07-31: 17:00:00 88 mg via INTRA_ARTICULAR

## 2021-07-31 MED ORDER — SODIUM HYALURONATE (VISCOSUP) 20 MG/2ML IX SOSY
20.0000 mg | PREFILLED_SYRINGE | INTRA_ARTICULAR | Status: AC | PRN
  Administered 2021-07-31: 17:00:00 20 mg via INTRA_ARTICULAR

## 2021-07-31 NOTE — Progress Notes (Signed)
   Procedure Note  Patient: [name] [name]             Date of Birth: Sep 16, 1960           MRN: [id number]             Visit Date: 07/31/2021  HPI:Ms. Sabina returns today for her third Euflexxa injection.  She states that overall her knee is doing well some achiness with change in weather but otherwise is tolerated the injections well without adverse effects.  Physical exam: Right knee good range of motion no abnormal warmth erythema or effusion  Procedures: Visit Diagnoses:  1. Primary osteoarthritis of right knee     Large Joint Inj on 07/31/2021 4:33 PM Indications: pain Details: 22 G 1.5 in needle, anterolateral approach  Arthrogram: No  Medications: 88 mg Hyaluronan 88 MG/4ML; 20 mg Sodium Hyaluronate 20 MG/2ML Outcome: tolerated well, no immediate complications Procedure, treatment alternatives, risks and benefits explained, specific risks discussed. Consent was given by the patient. Immediately prior to procedure a time out was called to verify the correct patient, procedure, equipment, support staff and site/side marked as required. Patient was prepped and draped in the usual sterile fashion.    Plan: She will follow-up with [place] as needed she knows to wait at least 6 months between injections.  Questions encouraged and answered.

## 2021-08-19 ENCOUNTER — Encounter: Payer: Self-pay | Admitting: Internal Medicine

## 2021-10-06 ENCOUNTER — Ambulatory Visit (INDEPENDENT_AMBULATORY_CARE_PROVIDER_SITE_OTHER): Payer: 59 | Admitting: Physician Assistant

## 2021-10-06 ENCOUNTER — Encounter: Payer: Self-pay | Admitting: Physician Assistant

## 2021-10-06 ENCOUNTER — Other Ambulatory Visit: Payer: Self-pay

## 2021-10-06 DIAGNOSIS — M1711 Unilateral primary osteoarthritis, right knee: Secondary | ICD-10-CM | POA: Diagnosis not present

## 2021-10-06 MED ORDER — METHYLPREDNISOLONE ACETATE 40 MG/ML IJ SUSP
40.0000 mg | INTRAMUSCULAR | Status: AC | PRN
  Administered 2021-10-06: 40 mg via INTRA_ARTICULAR

## 2021-10-06 MED ORDER — LIDOCAINE HCL 1 % IJ SOLN
3.0000 mL | INTRAMUSCULAR | Status: AC | PRN
  Administered 2021-10-06: 3 mL

## 2021-10-06 NOTE — Progress Notes (Signed)
° °  Procedure Note  Patient: [name] [name]             Date of Birth: [birth year]           MRN: [id number]             Visit Date: 10/06/2021  HPI: [name] [name] returns today due to right knee pain.  She last had Euflexxa injection on 07/31/2021 states these were helpful.  However she has had pain in her knee for the past 2 weeks after working out.  No swelling in the knee.  Physical exam: Right knee good range of motion.  No abnormal warmth or erythema positive effusion.  There is some slight ecchymosis.  Calf supple nontender.   Procedures: Visit Diagnoses:  1. Primary osteoarthritis of right knee     Large Joint Inj: R knee on 10/06/2021 3:03 PM Indications: pain Details: 22 G 1.5 in needle, anterolateral approach  Arthrogram: No  Medications: 3 mL lidocaine 1 %; 40 mg methylPREDNISolone acetate 40 MG/ML Aspirate: 22 mL yellow Outcome: tolerated well, no immediate complications Procedure, treatment alternatives, risks and benefits explained, specific risks discussed. Consent was given by the patient. Immediately prior to procedure a time out was called to verify the correct patient, procedure, equipment, support staff and site/side marked as required. Patient was prepped and draped in the usual sterile fashion.   Plan: She will follow-up with [place] as needed.  She understands that she is to wait least 3 months between cortisone injections.  She tolerated the aspiration injection knee well today.  Ace bandage was applied she will remove this before going to bed this evening.

## 2021-10-08 ENCOUNTER — Telehealth: Payer: Self-pay | Admitting: Internal Medicine

## 2021-10-08 ENCOUNTER — Other Ambulatory Visit: Payer: Self-pay | Admitting: Acute Care

## 2021-10-08 DIAGNOSIS — U099 Post covid-19 condition, unspecified: Secondary | ICD-10-CM

## 2021-10-08 DIAGNOSIS — U071 COVID-19: Secondary | ICD-10-CM

## 2021-10-08 MED ORDER — MOLNUPIRAVIR 200 MG PO CAPS: 4.0000 | ORAL_CAPSULE | Freq: Two times a day (BID) | ORAL | 0 refills | Status: AC

## 2021-10-08 NOTE — Telephone Encounter (Signed)
Called patient and left voicemail for her to call office back °

## 2021-10-08 NOTE — Telephone Encounter (Signed)
Tested positive for Covid this morning. Symptoms include sore throat, runny nose, chest congestion. All symptoms began last night. CY told her at last OV that if she were to ever get covid he would want her on one of the antivirals. Pharmacy is [facility] in [place].

## 2021-10-08 NOTE — Telephone Encounter (Signed)
Called and spoke with patient to let her know the recs from Judson Roch and to get her scheduled for OV with CXR with Dr. Annamaria Boots. Patient has been scheduled and CXR ordered. Nothing further needed at this time.

## 2021-10-08 NOTE — Telephone Encounter (Signed)
Called patient this morning. Patient states that her symptoms are a sore throat, runny nose, wheezing, no fever noted from patient. Symptoms started last night and tested positive for covid this morning. Patient states that her boyfriend had covid over the weekend so she was exposed over the weekend.

## 2021-10-27 ENCOUNTER — Ambulatory Visit (INDEPENDENT_AMBULATORY_CARE_PROVIDER_SITE_OTHER): Payer: 59

## 2021-10-27 ENCOUNTER — Ambulatory Visit: Payer: Managed Care, Other (non HMO) | Admitting: Orthopedic Surgery

## 2021-10-27 ENCOUNTER — Encounter: Payer: Self-pay | Admitting: Orthopedic Surgery

## 2021-10-27 ENCOUNTER — Other Ambulatory Visit: Payer: Self-pay

## 2021-10-27 VITALS — BP 119/77 | HR 71 | Ht 66.0 in | Wt 162.4 lb

## 2021-10-27 DIAGNOSIS — M25539 Pain in unspecified wrist: Secondary | ICD-10-CM

## 2021-10-27 DIAGNOSIS — M79641 Pain in right hand: Secondary | ICD-10-CM | POA: Diagnosis not present

## 2021-10-27 NOTE — Progress Notes (Signed)
Office Visit Note   Patient: [name] [name]           Date of Birth: 1961/02/11           MRN: [id number] Visit Date: 10/27/2021              Requested by: Bernerd Limbo, MD [address] [place],  [place] [id number] PCP: Bernerd Limbo, MD   Assessment & Plan: Visit Diagnoses:  1. Pain in right hand   2. Pain of ulnar side of wrist     Plan: Discussed with patient that somewhat difficult to come up with a working diagnosis given that she has no symptoms today and no provocative signs on exam.  We reviewed the x-ray which showed possible ulnar positivity with questionable lucency at the proximal ulnar corner of the lunate but it is unclear if this is a 0 rotation view of the wrist.  She has no pain at the proximal lunate.  She has no pain anywhere on exam today and is completely asymptomatic.  I like to see her back in the office when she is having symptoms to further evaluate.  Follow-Up Instructions: No follow-ups on file.   Orders:  Orders Placed This Encounter  Procedures   XR Hand Complete Right   No orders of the defined types were placed in this encounter.     Procedures: No procedures performed   Clinical Data: No additional findings.   Subjective: Chief Complaint  Patient presents with   Right Hand - New Patient (Initial Visit)    Is a 61 year old right-hand-dominant female who presents with previous ulnar-sided wrist pain.  She says this is been going on for around 6 months with 4 or 5 similar episodes.  Her most recent episode started on Thursday with sharp pain at the ulnar aspect of the wrist.  She points to the fovea.  She says that she had pain with any motion of the wrist and was unable to do her work secondary to pain.  Her pain resolved completely on Sunday after a brief period of immobilization and taking Advil.  She has no pain today.  She denies any numbness or paresthesias in her fingers.  Her previous episodes were similar to  this but did not last quite as long.  She denies any injury or other inciting event.   Review of Systems   Objective: Vital Signs: BP 119/77 (BP Location: Left Arm, Patient Position: Sitting, Cuff Size: Large)    Pulse 71    Ht 5\' 6"  (1.676 m)    Wt 162 lb 6.4 oz (73.7 kg)    SpO2 98%    BMI 26.21 kg/m   Physical Exam Constitutional:      Appearance: Normal appearance.  Cardiovascular:     Rate and Rhythm: Normal rate.     Pulses: Normal pulses.  Pulmonary:     Effort: Pulmonary effort is normal.  Skin:    General: Skin is warm and dry.     Capillary Refill: Capillary refill takes less than 2 seconds.  Neurological:     Mental Status: She is alert.    Right Hand Exam   Tenderness  The patient is experiencing no tenderness.   Range of Motion  The patient has normal right wrist ROM.   Other  Erythema: absent Sensation: normal Pulse: present  Comments:  + Tinel at elbow without ulnar instability.  Negative Tinel at carpal tunnel or Guyon's canal. No TTP throughout hand  and wrist.  No pain w/ ulnocarpal loading.  No pain w/ DRUJ compression.  No DRUJ instability.  Neg ECU synergy test without tendon instability.      Specialty Comments:  No specialty comments available.  Imaging: No results found.   PMFS History: Patient Active Problem List   Diagnosis Date Noted   Pain of ulnar side of wrist 10/27/2021   History of atrial fibrillation 05/28/2020   Pain in right knee 02/09/2020   Asthma, mild intermittent 06/21/2019   Vitamin D insufficiency 04/11/2019   History of basal cell carcinoma (BCC) 12/06/2017   Menopausal syndrome 07/25/2017   Primary osteoarthritis of right knee 01/15/2017   Face lesion 05/11/2016   Intrinsic eczema 03/05/2016   Hypertriglyceridemia 05/14/2015   Anaphylaxis due to insect venom 08/20/2014   Annual physical exam 08/20/2014   Essential hypertension 12/06/2013   Hx of abnormal Pap smear 01/15/2012   Perimenopausal vasomotor  symptoms 01/15/2012   S/P hysterectomy 01/15/2012   OTITIS MEDIA, RIGHT 04/26/2008   Seasonal allergic rhinitis 04/26/2008   Asthmatic bronchitis with acute exacerbation 04/26/2008   COLITIS 04/26/2008   Past Medical History:  Diagnosis Date   Allergic rhinitis, cause unspecified    Extrinsic asthma, unspecified    Hypertension    IBS (irritable bowel syndrome)    Other and unspecified noninfectious gastroenteritis and colitis(558.9)    25 years ago    Family History  Problem Relation Age of Onset   ALS Mother    Coronary artery disease Father    Hypertension Father    Hypertension Sister    Colon cancer Maternal Uncle 25   Hypertension Other        sibling   Bipolar disorder Other        sibling    Past Surgical History:  Procedure Laterality Date   BREAST REDUCTION SURGERY     BUNIONECTOMY  2004   Bil   KNEE ARTHROSCOPY     right knee   rhinoplasty     TOTAL ABDOMINAL HYSTERECTOMY  2004   WISDOM TOOTH EXTRACTION     Social History   Occupational History   Not on file  Tobacco Use   Smoking status: Former    Packs/day: 0.20    Years: 10.00    Pack years: 2.00    Types: Cigarettes    Quit date: 09/15/1991    Years since quitting: 30.1   Smokeless tobacco: Never   Tobacco comments:    was social smoker  Vaping Use   Vaping Use: Never used  Substance and Sexual Activity   Alcohol use: Yes    Alcohol/week: 3.0 standard drinks    Types: 3 Glasses of wine per week    Comment: 1-2 per day   Drug use: No   Sexual activity: Never    Partners: Male    Birth control/protection: None

## 2021-11-08 NOTE — Progress Notes (Signed)
Subjective:    Patient ID: [name] [name], female    DOB: 08-Aug-1961, 61 y.o.   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 Mostly upper respiratory symptoms, followed by easy fatigue for another week or 2. Now essentially back to baseline.  No hx blood clotting disorder but continues BASA after single episode of PAFib years ago. Feels asthma well-controlled, has meds. CXR reviewed by me pending radiology report. Markings a little prominent. Old calcification mid left chest. No hx TB exposure.  CXR 11/11/21-   ROS-see HPI + = positive Constitutional:   No-   weight loss, night sweats, fevers, chills, fatigue, lassitude. HEENT:   No-  headaches, difficulty swallowing, tooth/dental problems, sore throat,       No-  sneezing, itching, ear ache, nasal congestion, post nasal drip,  CV:  No-   chest pain, orthopnea, PND, swelling in lower extremities, anasarca, dizziness, +palpitations Resp: No-   shortness of breath with exertion or at rest.          productive cough,  No non-productive cough,  No- coughing up of blood.             change in color of mucus.  No- wheezing.    Skin: No-   rash or lesions. GI:  No-   heartburn, indigestion, abdominal pain, nausea, vomiting, GU: . MS:  No-   joint pain or swelling.   Neuro-     nothing unusual Psych:  No- change in mood or affect. No depression or anxiety.  No memory loss.   Objective:   Physical Exam General- Alert, Oriented, Affect-appropriate, Distress- none acute   Skin- rash-none, lesions- none, excoriation- none Lymphadenopathy- none Head- atraumatic            Eyes- Gross vision intact, PERRLA, conjunctivae clear secretions            Ears- Hearing, canals normal            Nose- Clear, Mild- Septal dev, narrower on left,    No- mucus, polyps, erosion, perforation             Throat- Mallampati II , mucosa clear , drainage- none, tonsils- atrophic Neck- flexible , trachea midline, no stridor , thyroid nl, carotid no bruit Chest - symmetrical excursion , unlabored           Heart/CV- RRR , no murmur , no gallop  , no rub, nl s1 s2                           - JVD- none , edema- none, stasis changes- none, varices- none           Lung- clear to P&A, wheeze- none, cough- none, dullness-none, rub- none  Chest wall-  Abd-  Br/ Gen/ Rectal- Not done, not indicated Extrem- cyanosis- none, clubbing, none, atrophy- none, strength- nl Neuro- grossly intact to observation   Assessment & Plan:

## 2021-11-11 ENCOUNTER — Other Ambulatory Visit: Payer: Self-pay

## 2021-11-11 ENCOUNTER — Ambulatory Visit (INDEPENDENT_AMBULATORY_CARE_PROVIDER_SITE_OTHER): Payer: Managed Care, Other (non HMO)

## 2021-11-11 ENCOUNTER — Encounter: Payer: Self-pay | Admitting: Internal Medicine

## 2021-11-11 ENCOUNTER — Ambulatory Visit: Payer: Managed Care, Other (non HMO) | Admitting: Internal Medicine

## 2021-11-11 DIAGNOSIS — U099 Post covid-19 condition, unspecified: Secondary | ICD-10-CM | POA: Diagnosis not present

## 2021-11-11 DIAGNOSIS — U071 COVID-19: Secondary | ICD-10-CM | POA: Diagnosis not present

## 2021-11-11 NOTE — Assessment & Plan Note (Signed)
No evident lingering sequelae Suggested a little extra attention to "clot avoidance" for now. Pending CXR report from radiology

## 2021-11-11 NOTE — Patient Instructions (Addendum)
Glad you are feeling better  Please call if we can help  Ok to keep November appointment

## 2021-11-13 ENCOUNTER — Telehealth: Payer: Self-pay | Admitting: Internal Medicine

## 2021-11-14 NOTE — Telephone Encounter (Signed)
Deneise Lever, MD  ?11/13/2021  1:50 PM EST   ?  ?CXR read as clear and normal. Radiologist didn't comment on the small old calcium scar we noted. It isn't important  ? ? ? ?Called and spoke with pt letting her know the results of cxr and she verbalized understanding. Nothing further needed. ?

## 2021-11-24 ENCOUNTER — Ambulatory Visit (INDEPENDENT_AMBULATORY_CARE_PROVIDER_SITE_OTHER): Payer: Managed Care, Other (non HMO)

## 2021-11-24 ENCOUNTER — Encounter: Payer: Self-pay | Admitting: Podiatry

## 2021-11-24 ENCOUNTER — Ambulatory Visit: Payer: Managed Care, Other (non HMO) | Admitting: Podiatry

## 2021-11-24 ENCOUNTER — Other Ambulatory Visit: Payer: Self-pay

## 2021-11-24 DIAGNOSIS — M779 Enthesopathy, unspecified: Secondary | ICD-10-CM

## 2021-11-24 DIAGNOSIS — M7751 Other enthesopathy of right foot: Secondary | ICD-10-CM

## 2021-11-24 MED ORDER — TRIAMCINOLONE ACETONIDE 10 MG/ML IJ SUSP
10.0000 mg | Freq: Once | INTRAMUSCULAR | Status: AC
  Administered 2021-11-24: 10 mg

## 2021-11-24 MED ORDER — DICLOFENAC SODIUM 75 MG PO TBEC: 75.0000 mg | DELAYED_RELEASE_TABLET | Freq: Two times a day (BID) | ORAL | 2 refills | Status: DC

## 2021-11-24 NOTE — Progress Notes (Signed)
Subjective:  ? ?Patient ID: [name] [name], female   DOB: 61 y.o.   MRN: [id number]  ? ?HPI ?Patient presents stating she has been getting a lot of pain in her right forefoot and does not remember specific injury.  States its been going on for couple months and hard to walk on ? ? ?ROS ? ? ?   ?Objective:  ?Physical Exam  ?Neurovascular status intact with inflammation around the sesamoidal complex right fluid buildup around the joint surface no other pathology mild hammertoe deformity ? ?   ?Assessment:  ?Acute capsulitis of the right plantar MPJ and sesamoidal complex ? ?   ?Plan:  ?H&P reviewed condition went ahead today and did sterile prep and carefully injected the sesamoidal complex 3 mg dexamethasone Kenalog 5 mg Xylocaine applied thick plantar padding to reduce pressure on the joint surface and advised on rigid bottom shoes.  Reappoint to recheck.  I do not think this is a fracture but if it were to continue to persist we may need to get an MRI may require immobilization and is possible to sesamoid that would need to be removed in future but again it appears to be more inflammatory than any indication of fracture ? ?X-rays indicate there was previous bunion done and there may be a what appears to be bipartite sesamoid I do not think there is a crack in the sesamoid bone but may need to be evaluated in future ?   ? ? ?

## 2022-01-07 ENCOUNTER — Telehealth: Payer: Self-pay | Admitting: *Deleted

## 2022-01-07 ENCOUNTER — Encounter: Payer: Self-pay | Admitting: Podiatry

## 2022-01-07 ENCOUNTER — Ambulatory Visit: Payer: BC Managed Care – PPO | Admitting: Podiatry

## 2022-01-07 DIAGNOSIS — M722 Plantar fascial fibromatosis: Secondary | ICD-10-CM | POA: Diagnosis not present

## 2022-01-07 NOTE — Progress Notes (Signed)
?  Subjective:  ?Patient ID: [name] [name], female    DOB: 02/27/1961,   MRN: [id number] ? ?Chief Complaint  ?Patient presents with  ? Cyst  ?  Bumps/cysts under bottom of foot under skin  ? ? ?61 y.o. female presents for concern of bilateral bumps on the bottom of both feet. She relates she has had them for a while and has seen Dr. Cannon Kettle about them in the past. She has used verapamil cream and not sure it has helped. Relates pain is on and off and not currently hurting unless wearing certain shoes.  . Denies any other pedal complaints. Denies n/v/f/c.  ? ?Past Medical History:  ?Diagnosis Date  ? Allergic rhinitis, cause unspecified   ? Extrinsic asthma, unspecified   ? Hypertension   ? IBS (irritable bowel syndrome)   ? Other and unspecified noninfectious gastroenteritis and colitis(558.9)   ? 25 years ago  ? ? ?Objective:  ?Physical Exam: ?Vascular: DP/PT pulses 2/4 bilateral. CFT <3 seconds. Normal hair growth on digits. No edema.  ?Skin. No lacerations or abrasions bilateral feet. Bilateral firm non-mobile nodules noted in the mid medial arch bilateral. Minimally tender today.  ?Musculoskeletal: MMT 5/5 bilateral lower extremities in DF, PF, Inversion and Eversion. Deceased ROM in DF of ankle joint.  ?Neurological: Sensation intact to light touch.  ? ?Assessment:  ? ?1. Plantar fascial fibromatosis   ? ? ? ?Plan:  ?Patient was evaluated and treated and all questions answered. ?Discussed plantar fibromas with patient.  ?X-rays reviewed and discussed with patient. No acute fractures or dislocations noted. Mild spurring noted at inferior calcaneus.  ?Discussed treatment options including, ice, NSAIDS, supportive shoes, bracing, and stretching. Stretching exercises provided to be done on a daily basis.   ?Padding dispensed to offload area.  ?Prescription for verapamil provided.  ?Discussed other options like injection if needed in the future or surgery.  ?Follow-up as needed ? ? ? ? ? ?Lorenda Peck, DPM   ? ? ?

## 2022-02-24 ENCOUNTER — Telehealth: Payer: Self-pay | Admitting: Orthopaedic Surgery

## 2022-02-24 ENCOUNTER — Other Ambulatory Visit (HOSPITAL_COMMUNITY): Payer: Self-pay

## 2022-02-24 MED ORDER — ERGOCALCIFEROL 1.25 MG (50000 UT) PO CAPS
ORAL_CAPSULE | ORAL | 1 refills | Status: DC
Start: 2021-04-16 — End: 2022-08-31
  Filled 2022-02-24: qty 12, 84d supply, fill #0

## 2022-02-24 MED ORDER — ESTRADIOL 0.05 MG/24HR TD PTTW
MEDICATED_PATCH | TRANSDERMAL | 8 refills | Status: DC
  Filled 2022-02-24: qty 8, 28d supply, fill #0
  Filled 2022-04-28: qty 8, 28d supply, fill #1
  Filled 2022-05-31: qty 8, 28d supply, fill #2

## 2022-02-24 MED ORDER — LISINOPRIL 10 MG PO TABS
ORAL_TABLET | ORAL | 10 refills | Status: DC
  Filled 2022-02-24: qty 90, 90d supply, fill #0
  Filled 2022-05-31: qty 90, 90d supply, fill #1
  Filled 2022-08-18: qty 90, 90d supply, fill #2

## 2022-02-24 MED ORDER — NEBIVOLOL HCL 10 MG PO TABS
ORAL_TABLET | ORAL | 10 refills | Status: DC
  Filled 2022-02-24: qty 90, 90d supply, fill #0
  Filled 2022-05-31: qty 90, 90d supply, fill #1
  Filled 2022-08-18: qty 90, 90d supply, fill #2

## 2022-02-24 NOTE — Telephone Encounter (Signed)
Pt calling to see if we can put in another order for Euflexxa.   Cb [phone]   [employer] insurance is the correct one

## 2022-02-24 NOTE — Telephone Encounter (Signed)
Submitted for Euflexxa, right knee BV pending

## 2022-02-25 ENCOUNTER — Other Ambulatory Visit (HOSPITAL_COMMUNITY): Payer: Self-pay

## 2022-03-03 ENCOUNTER — Ambulatory Visit: Payer: 59 | Admitting: Orthopedic Surgery

## 2022-03-03 ENCOUNTER — Ambulatory Visit (INDEPENDENT_AMBULATORY_CARE_PROVIDER_SITE_OTHER): Payer: 59

## 2022-03-03 DIAGNOSIS — M25531 Pain in right wrist: Secondary | ICD-10-CM | POA: Diagnosis not present

## 2022-03-03 DIAGNOSIS — M25539 Pain in unspecified wrist: Secondary | ICD-10-CM

## 2022-03-03 NOTE — Progress Notes (Signed)
Office Visit Note   Patient: [name] [name]           Date of Birth: [birth year]           MRN: [id number] Visit Date: 03/03/2022              Requested by: Bernerd Limbo, MD [address] [place],  [place] [id number] PCP: Bernerd Limbo, MD   Assessment & Plan: Visit Diagnoses:  1. Pain of ulnar side of wrist     Plan: Patient seems to point to the pisotriquetral joint as the area of most severe pain.  X-rays today including a pisotriquetral view demonstrate arthritis of the articulation.  She also seems to be ulnar positive with a questionable lucency at the proximal ulnar aspect of the lunate but she has no pain at the dorsal central aspect of the wrist at the SL interval or the LT interval.  We reviewed the nature of arthritis as well as the diagnosis, prognosis, both conservative and surgical treatment options.  At this point, she would like to continue taking over-the-counter ibuprofen as needed.  I can see her back again as needed.  Follow-Up Instructions: No follow-ups on file.   Orders:  Orders Placed This Encounter  Procedures   XR Wrist 2 Views Right   No orders of the defined types were placed in this encounter.     Procedures: No procedures performed   Clinical Data: No additional findings.   Subjective: Chief Complaint  Patient presents with   Right Hand - Follow-up    This is a 61 year old right-hand-dominant female who presents for follow-up of her right ulnar-sided wrist pain.  She was last seen in office in early February at which time she described previous ulnar-sided wrist pain that had since resolved.  She had no positive findings on exam at that time.  Today she notes that her pain started acutely yesterday.  She denies any obvious inciting event although she does note that she was working on a flooded room in her house yesterday.  The pain is localized to the ulnar aspects of the wrist.  She points to the pisiform as the area of  the most severe pain.  Pain is worse with some activities that involve extension at the wrist and a tight grip.[name]  She has no clicking, catching, or other mechanical symptoms in the wrist.  She notes that her symptoms are already improving from yesterday.    Review of Systems   Objective: Vital Signs: There were no vitals taken for this visit.  Physical Exam  Right Hand Exam   Tenderness  Right hand tenderness location: TTP at pisiform and ulnar fovea.  Range of Motion  The patient has normal right wrist ROM.   Other  Erythema: absent Sensation: normal Pulse: present  Comments:  Pisotriquetral grind test seems to reproduce her pain.  No pain w/ shucking of the DRUJ, ulnocarpal loading, or ECU synergy test.      Specialty Comments:  No specialty comments available.  Imaging: No results found.   PMFS History: Patient Active Problem List   Diagnosis Date Noted   COVID-19 virus infection 11/11/2021   Pain of ulnar side of wrist 10/27/2021   History of atrial fibrillation 05/28/2020   Pain in right knee 02/09/2020   Asthma, mild intermittent 06/21/2019   Vitamin D insufficiency 04/11/2019   History of basal cell carcinoma (BCC) 12/06/2017   Menopausal syndrome 07/25/2017   Primary osteoarthritis of right  knee 01/15/2017   Face lesion 05/11/2016   Intrinsic eczema 03/05/2016   Hypertriglyceridemia 05/14/2015   Anaphylaxis due to insect venom 08/20/2014   Annual physical exam 08/20/2014   Essential hypertension 12/06/2013   Hx of abnormal Pap smear 01/15/2012   Perimenopausal vasomotor symptoms 01/15/2012   S/P hysterectomy 01/15/2012   OTITIS MEDIA, RIGHT 04/26/2008   Seasonal allergic rhinitis 04/26/2008   Asthmatic bronchitis with acute exacerbation 04/26/2008   COLITIS 04/26/2008   Past Medical History:  Diagnosis Date   Allergic rhinitis, cause unspecified    Extrinsic asthma, unspecified    Hypertension    IBS (irritable bowel syndrome)    Other and  unspecified noninfectious gastroenteritis and colitis(558.9)    25 years ago    Family History  Problem Relation Age of Onset   ALS Mother    Coronary artery disease Father    Hypertension Father    Hypertension Sister    Colon cancer Maternal Uncle 23   Hypertension Other        sibling   Bipolar disorder Other        sibling    Past Surgical History:  Procedure Laterality Date   BREAST REDUCTION SURGERY     BUNIONECTOMY  2004   Bil   KNEE ARTHROSCOPY     right knee   rhinoplasty     TOTAL ABDOMINAL HYSTERECTOMY  2004   WISDOM TOOTH EXTRACTION     Social History   Occupational History   Not on file  Tobacco Use   Smoking status: Former    Packs/day: 0.20    Years: 10.00    Total pack years: 2.00    Types: Cigarettes    Quit date: 09/15/1991    Years since quitting: 30.4   Smokeless tobacco: Never   Tobacco comments:    was social smoker  Vaping Use   Vaping Use: Never used  Substance and Sexual Activity   Alcohol use: Yes    Alcohol/week: 3.0 standard drinks of alcohol    Types: 3 Glasses of wine per week    Comment: 1-2 per day   Drug use: No   Sexual activity: Never    Partners: Male    Birth control/protection: None

## 2022-03-04 ENCOUNTER — Other Ambulatory Visit (HOSPITAL_COMMUNITY): Payer: Self-pay

## 2022-03-04 MED ORDER — PAROXETINE HCL 10 MG PO TABS
ORAL_TABLET | ORAL | 2 refills | Status: DC
Start: 2022-03-04 — End: 2022-05-30
  Filled 2022-03-04: qty 30, 30d supply, fill #0
  Filled 2022-03-28: qty 30, 30d supply, fill #1
  Filled 2022-04-24: qty 30, 30d supply, fill #2

## 2022-03-28 ENCOUNTER — Other Ambulatory Visit (HOSPITAL_COMMUNITY): Payer: Self-pay

## 2022-04-15 ENCOUNTER — Other Ambulatory Visit (HOSPITAL_COMMUNITY): Payer: Self-pay

## 2022-04-15 MED ORDER — EPINEPHRINE 0.3 MG/0.3ML IJ SOAJ
INTRAMUSCULAR | 0 refills | Status: AC
Start: 2022-04-15 — End: ?
  Filled 2022-04-15: qty 2, 30d supply, fill #0

## 2022-04-17 ENCOUNTER — Ambulatory Visit (HOSPITAL_COMMUNITY)
Admission: RE | Admit: 2022-04-17 | Discharge: 2022-04-17 | Disposition: A | Discharge status: Home / Self Care | Payer: 59 | Source: Ambulatory Visit | Attending: Physician Assistant | Admitting: Physician Assistant

## 2022-04-17 ENCOUNTER — Other Ambulatory Visit (HOSPITAL_COMMUNITY): Payer: Self-pay

## 2022-04-17 ENCOUNTER — Encounter (HOSPITAL_COMMUNITY): Payer: Self-pay | Admitting: Physician Assistant

## 2022-04-17 VITALS — BP 98/70 | HR 106 | Ht 66.0 in | Wt 163.0 lb

## 2022-04-17 DIAGNOSIS — I10 Essential (primary) hypertension: Secondary | ICD-10-CM | POA: Diagnosis not present

## 2022-04-17 DIAGNOSIS — Z8249 Family history of ischemic heart disease and other diseases of the circulatory system: Secondary | ICD-10-CM | POA: Insufficient documentation

## 2022-04-17 DIAGNOSIS — Z87891 Personal history of nicotine dependence: Secondary | ICD-10-CM | POA: Insufficient documentation

## 2022-04-17 DIAGNOSIS — Z79899 Other long term (current) drug therapy: Secondary | ICD-10-CM | POA: Insufficient documentation

## 2022-04-17 DIAGNOSIS — I480 Paroxysmal atrial fibrillation: Secondary | ICD-10-CM | POA: Diagnosis present

## 2022-04-17 MED ORDER — APIXABAN 5 MG PO TABS
5.0000 mg | ORAL_TABLET | Freq: Two times a day (BID) | ORAL | 3 refills | Status: DC
  Filled 2022-04-17: qty 60, 30d supply, fill #0

## 2022-04-17 NOTE — Patient Instructions (Addendum)
Stop aspirin  Start Eliquis '5mg'$  twice a day  HOLD lisinopril  Increase bystolic to '10mg'$  twice a day

## 2022-04-17 NOTE — Progress Notes (Signed)
Primary Care Physician: Bernerd Limbo, MD Primary Cardiologist: Dr Shirlee More (remotely) Primary Electrophysiologist: none Referring Physician: Dr Barbie Banner referred    [name] [name] is a 61 y.o. female with a history of HTN and atrial fibrillation who presents for consultation in the [facility]. Patient is a Marine scientist at [employer]. The patient was initially diagnosed with atrial fibrillation in 2020 after presenting to her PCP with symptoms of palpitations. She spontaneously converted while in office. She had a stress echo with Dr Shirlee More which was normal. She had not had any interim afib until she woke yesterday in afib, confirmed by her smart watch. Patient does admit she had more alcohol than usual the night prior at a concert. She took extra doses of BB with minimal improvement. ECG today showed afib HR 106. Patient has for a CHADS2VASC score of 2.   Today, she denies symptoms of chest pain, shortness of breath, orthopnea, PND, lower extremity edema, dizziness, presyncope, syncope, snoring, daytime somnolence, bleeding, or neurologic sequela. The patient is tolerating medications without difficulties and is otherwise without complaint today.    Atrial Fibrillation Risk Factors:  she does not have symptoms or diagnosis of sleep apnea. she does not have a history of rheumatic fever. she does have a history of alcohol use. The patient does not have a history of early familial atrial fibrillation or other arrhythmias.  she has a BMI of Body mass index is 26.31 kg/m.[name] Filed Weights   04/17/22 1137  Weight: 73.9 kg    Family History  Problem Relation Age of Onset   ALS Mother    Coronary artery disease Father    Hypertension Father    Hypertension Sister    Colon cancer Maternal Uncle 27   Hypertension Other        sibling   Bipolar disorder Other        sibling     Atrial Fibrillation Management history:  Previous antiarrhythmic  drugs: none Previous cardioversions: none Previous ablations: none CHADS2VASC score: 2 Anticoagulation history: none   Past Medical History:  Diagnosis Date   Allergic rhinitis, cause unspecified    Extrinsic asthma, unspecified    Hypertension    IBS (irritable bowel syndrome)    Other and unspecified noninfectious gastroenteritis and colitis(558.9)    25 years ago   Past Surgical History:  Procedure Laterality Date   BREAST REDUCTION SURGERY     BUNIONECTOMY  2004   Bil   KNEE ARTHROSCOPY     right knee   rhinoplasty     TOTAL ABDOMINAL HYSTERECTOMY  2004   WISDOM TOOTH EXTRACTION      Current Outpatient Medications  Medication Sig Dispense Refill   albuterol (PROVENTIL) (2.5 MG/3ML) 0.083% nebulizer solution Take 3 mLs (2.5 mg total) by nebulization every 6 (six) hours as needed for wheezing or shortness of breath. 75 mL 12   albuterol (VENTOLIN HFA) 108 (90 Base) MCG/ACT inhaler INHALE TWO PUFFS INTO THE LUNGS EVERY 6 HOURS AS NEEDED FOR WHEEZING OR SHORTNESS OF BREATH. 18 each 12   apixaban (ELIQUIS) 5 MG TABS tablet Take 1 tablet by mouth 2 times daily. 60 tablet 3   Calcium Carbonate-Vit D-Min 600-400 MG-UNIT TABS Take 1 tablet by mouth daily.       cetirizine (ZYRTEC) 10 MG tablet Take 10 mg by mouth daily.       diclofenac (VOLTAREN) 75 MG EC tablet Take 1 tablet (75 mg total) by mouth 2 (two) times daily. [place]  tablet 2   EPINEPHrine 0.3 mg/0.3 mL IJ SOAJ injection INJECT 0.3 MLS (0.3 MG DOSE) INTO THE MUSCLE ONCE AS NEEDED FOR ANAPHYLAXIS FOR UP TO ONE DOSE 2 each 0   ergocalciferol (VITAMIN D2) 1.25 MG (50000 UT) capsule Take 1 capsule by mouth weekly for 12 doses. 12 capsule 1   estradiol (VIVELLE-DOT) 0.05 MG/24HR patch Place 1 patch onto the skin 2 (two) times a week.     estradiol (VIVELLE-DOT) 0.05 MG/24HR patch Place 1 patch onto the skin twice a week. 8 patch 8   fluticasone-salmeterol (ADVAIR DISKUS) 100-50 MCG/ACT AEPB INHALE ONE PUFF INTO THE LUNGS TWICE A  DAY THEN RINSE MOUTH 60 each 12   ibuprofen (ADVIL) 200 MG tablet Advil 200 mg tablet     lisinopril (ZESTRIL) 10 MG tablet Take 1 tablet by mouth daily. 90 tablet 10   Misc Natural Products (PROGESTERONE) 1000 MG/60GM CREA Apply topically.       Multiple Vitamin (MULTIVITAMIN) capsule Take 1 capsule by mouth daily.       nebivolol (BYSTOLIC) 10 MG tablet Take 1 tablet by mouth daily. 90 tablet 10   NON FORMULARY [facility] Scar Cream: Verapamil 10%; Pentoxifylline 5%  Apply 1 to 2 gm to affected areas 3-4x daily prn (100 gm)  - for Plantar Fibromas in arches  Faxed over to [facility] on 08/08/19 with refills as needed     PARoxetine (PAXIL) 10 MG tablet Take one tablet (10 mg dose) by mouth daily. 30 tablet 2   Psyllium Husk 100 % POWD psyllium husk     Turmeric 1053 MG TABS turmeric     UNABLE TO FIND Med Name: Allergy/Dust mite mattress cover  Dx J30.2 and J45.901 1 Device 0   Current Facility-Administered Medications  Medication Dose Route Frequency Provider Last Rate Last Admin   0.9 %  sodium chloride infusion  500 mL Intravenous Continuous Pyrtle, Lajuan Lines, MD       diclofenac sodium (VOLTAREN) 1 % transdermal gel 2 g  2 g Topical QID Erskine Emery W, PA-C        Allergies  Allergen Reactions   Bee Venom Anaphylaxis   Amlodipine     Feet swelled   Benicar [Olmesartan]     diarrhea   Codeine     REACTION: nausea   Flagyl [Metronidazole]     Nausea and vomiting   Hctz [Hydrochlorothiazide]     itching    Social History   Socioeconomic History   Marital status: Significant Other    Spouse name: Not on file   Number of children: Not on file   Years of education: Not on file   Highest education level: Not on file  Occupational History   Not on file  Tobacco Use   Smoking status: Former    Packs/day: 0.20    Years: 10.00    Total pack years: 2.00    Types: Cigarettes    Quit date: 09/15/1991    Years since quitting: 30.6   Smokeless tobacco:  Never   Tobacco comments:    Former smoker 04/17/22  Vaping Use   Vaping Use: Never used  Substance and Sexual Activity   Alcohol use: Yes    Alcohol/week: 4.0 - 6.0 standard drinks of alcohol    Types: 4 - 6 Cans of beer per week    Comment: 2-3 beers 2-3 days a week 04/17/22   Drug use: No   Sexual activity: Never    Partners: Male  Birth control/protection: None  Other Topics Concern   Not on file  Social History Narrative   Not on file   Social Determinants of Health   Financial Resource Strain: Not on file  Food Insecurity: Not on file  Transportation Needs: Not on file  Physical Activity: Not on file  Stress: Not on file  Social Connections: Not on file  Intimate Partner Violence: Not on file     ROS- All systems are reviewed and negative except as per the HPI above.  Physical Exam: Vitals:   04/17/22 1137  BP: 98/70  Pulse: (!) 106  Weight: 73.9 kg  Height: '5\' 6"'$  (1.676 m)    GEN- The patient is a well appearing female, alert and oriented x 3 today.   Head- normocephalic, atraumatic Eyes-  Sclera clear, conjunctiva pink Ears- hearing intact Oropharynx- clear Neck- supple  Lungs- Clear to ausculation bilaterally, normal work of breathing Heart- irregular rate and rhythm, no murmurs, rubs or gallops  GI- soft, NT, ND, + BS Extremities- no clubbing, cyanosis, or edema MS- no significant deformity or atrophy Skin- no rash or lesion Psych- euthymic mood, full affect Neuro- strength and sensation are intact  Wt Readings from Last 3 Encounters:  04/17/22 73.9 kg  11/11/21 74.4 kg  10/27/21 73.7 kg    EKG today demonstrates  Afib Vent. rate 106 BPM PR interval * ms QRS duration 62 ms QT/QTcB 338/448 ms   Epic records are reviewed at length today  CHA2DS2-VASc Score = 2  The patient's score is based upon: CHF History: 0 HTN History: 1 Diabetes History: 0 Stroke History: 0 Vascular Disease History: 0 Age Score: 0 Gender Score: 1        ASSESSMENT AND PLAN: 1. Paroxysmal Atrial Fibrillation (ICD10:  I48.0) The patient's CHA2DS2-VASc score is 2, indicating a 2.2% annual risk of stroke.   General education about afib provided and questions answered. We also discussed her stroke risk and the risks and benefits of anticoagulation. Patient converted to SR right after leaving the office today.  Will not need to start anticoagulation now with episode <48 hours and low CV score.  Suspect episode related to alcohol use.  Continue nebivolol 10 mg daily  Apple Watch for home monitoring. She may be a good candidate for front-line ablation  if she has early return of her afib.   2. HTN Stable, no changes today.   Follow up in the AF clinic in 6-8 weeks. Will consider repeat echo since she is back in SR.    [facility] [address] [place], [place] [address] [phone] 04/17/2022 2:17 PM

## 2022-04-18 ENCOUNTER — Other Ambulatory Visit (HOSPITAL_COMMUNITY): Payer: Self-pay

## 2022-04-24 ENCOUNTER — Ambulatory Visit (HOSPITAL_COMMUNITY): Payer: 59 | Admitting: Physician Assistant

## 2022-04-24 ENCOUNTER — Other Ambulatory Visit (HOSPITAL_COMMUNITY): Payer: Self-pay

## 2022-04-28 ENCOUNTER — Other Ambulatory Visit: Payer: Self-pay | Admitting: Internal Medicine

## 2022-04-28 ENCOUNTER — Other Ambulatory Visit (HOSPITAL_COMMUNITY): Payer: Self-pay

## 2022-04-28 MED ORDER — FLUTICASONE-SALMETEROL 100-50 MCG/ACT IN AEPB
INHALATION_SPRAY | RESPIRATORY_TRACT | 6 refills | Status: DC
  Filled 2022-04-28: qty 60, 30d supply, fill #0
  Filled 2022-05-31: qty 60, 30d supply, fill #1
  Filled 2022-08-18: qty 60, 30d supply, fill #2
  Filled 2022-09-29: qty 60, 30d supply, fill #3
  Filled 2022-11-30: qty 60, 30d supply, fill #4
  Filled 2023-01-13: qty 60, 30d supply, fill #5
  Filled 2023-04-06: qty 60, 30d supply, fill #6

## 2022-04-29 ENCOUNTER — Other Ambulatory Visit (HOSPITAL_COMMUNITY): Payer: Self-pay

## 2022-04-29 NOTE — Telephone Encounter (Signed)
error 

## 2022-05-04 ENCOUNTER — Other Ambulatory Visit: Payer: Self-pay

## 2022-05-04 DIAGNOSIS — M1711 Unilateral primary osteoarthritis, right knee: Secondary | ICD-10-CM

## 2022-05-29 ENCOUNTER — Ambulatory Visit (HOSPITAL_COMMUNITY)
Admission: RE | Admit: 2022-05-29 | Discharge: 2022-05-29 | Disposition: A | Discharge status: Home / Self Care | Payer: 59 | Source: Ambulatory Visit | Attending: Physician Assistant | Admitting: Physician Assistant

## 2022-05-29 VITALS — BP 162/76 | HR 57 | Ht 66.0 in | Wt 166.8 lb

## 2022-05-29 DIAGNOSIS — I10 Essential (primary) hypertension: Secondary | ICD-10-CM | POA: Diagnosis not present

## 2022-05-29 DIAGNOSIS — I480 Paroxysmal atrial fibrillation: Secondary | ICD-10-CM | POA: Diagnosis present

## 2022-05-29 NOTE — Progress Notes (Signed)
Primary Care Physician: Bernerd Limbo, MD Primary Cardiologist: Dr Shirlee More (remotely) Dr Johney Frame (new) Primary Electrophysiologist: none Referring Physician: Dr Barbie Banner referred    [name] [name] is a 61 y.o. female with a history of HTN and atrial fibrillation who presents for follow up in the [facility]. Patient is a Marine scientist at [employer]. The patient was initially diagnosed with atrial fibrillation in 2020 after presenting to her PCP with symptoms of palpitations. She spontaneously converted while in office. She had a stress echo with Dr Shirlee More which was normal. She had not had any interim afib until she woke 04/16/22 in afib, confirmed by her smart watch. Patient does admit she had more alcohol than usual the night prior at a concert. She took extra doses of BB with minimal improvement. ECG showed afib HR 106. Patient has for a CHADS2VASC score of 2. She called the clinic right after leaving stating that she had converted back to SR.  On follow up today, patient reports that she has done well since her last visit. She had not had any episodes of afib in the interim.   Today, she denies symptoms of palpitations, chest pain, shortness of breath, orthopnea, PND, lower extremity edema, dizziness, presyncope, syncope, snoring, daytime somnolence, bleeding, or neurologic sequela. The patient is tolerating medications without difficulties and is otherwise without complaint today.    Atrial Fibrillation Risk Factors:  she does not have symptoms or diagnosis of sleep apnea. she does not have a history of rheumatic fever. she does have a history of alcohol use. The patient does not have a history of early familial atrial fibrillation or other arrhythmias.  she has a BMI of Body mass index is 26.92 kg/m.[name] Filed Weights   05/29/22 0823  Weight: 75.7 kg    Family History  Problem Relation Age of Onset   ALS Mother    Coronary artery disease Father     Hypertension Father    Hypertension Sister    Colon cancer Maternal Uncle 79   Hypertension Other        sibling   Bipolar disorder Other        sibling     Atrial Fibrillation Management history:  Previous antiarrhythmic drugs: none Previous cardioversions: none Previous ablations: none CHADS2VASC score: 2 Anticoagulation history: none   Past Medical History:  Diagnosis Date   Allergic rhinitis, cause unspecified    Extrinsic asthma, unspecified    Hypertension    IBS (irritable bowel syndrome)    Other and unspecified noninfectious gastroenteritis and colitis(558.9)    25 years ago   Past Surgical History:  Procedure Laterality Date   BREAST REDUCTION SURGERY     BUNIONECTOMY  2004   Bil   KNEE ARTHROSCOPY     right knee   rhinoplasty     TOTAL ABDOMINAL HYSTERECTOMY  2004   WISDOM TOOTH EXTRACTION      Current Outpatient Medications  Medication Sig Dispense Refill   albuterol (VENTOLIN HFA) 108 (90 Base) MCG/ACT inhaler INHALE TWO PUFFS INTO THE LUNGS EVERY 6 HOURS AS NEEDED FOR WHEEZING OR SHORTNESS OF BREATH. 18 each 12   Albuterol Sulfate 2.5 MG/0.5ML NEBU Inhale into the lungs as needed.     Calcium Carbonate-Vit D-Min 600-400 MG-UNIT TABS Take 1 tablet by mouth daily.       cetirizine (ZYRTEC) 10 MG tablet Take 10 mg by mouth as needed.     EPINEPHrine 0.3 mg/0.3 mL IJ SOAJ injection INJECT 0.3 MLS (  0.3 MG DOSE) INTO THE MUSCLE ONCE AS NEEDED FOR ANAPHYLAXIS FOR UP TO ONE DOSE 2 each 0   ergocalciferol (VITAMIN D2) 1.25 MG (50000 UT) capsule Take 1 capsule by mouth weekly for 12 doses. 12 capsule 1   estradiol (VIVELLE-DOT) 0.05 MG/24HR patch Place 1 patch onto the skin twice a week. 8 patch 8   fluticasone-salmeterol (ADVAIR DISKUS) 100-50 MCG/ACT AEPB INHALE ONE PUFF INTO THE LUNGS TWICE A DAY THEN RINSE MOUTH 60 each 6   ibuprofen (ADVIL) 200 MG tablet Take 200 mg by mouth as needed.     lisinopril (ZESTRIL) 10 MG tablet Take 1 tablet by mouth daily.  90 tablet 10   Misc Natural Products (PROGESTERONE) 1000 MG/60GM CREA Apply topically as needed.     Multiple Vitamin (MULTIVITAMIN) capsule Take 1 capsule by mouth daily.       nebivolol (BYSTOLIC) 10 MG tablet Take 1 tablet by mouth daily. 90 tablet 10   NON FORMULARY [facility] Scar Cream: Verapamil 10%; Pentoxifylline 5%  Apply 1 to 2 gm to affected areas 3-4x daily prn (100 gm)  - for Plantar Fibromas in arches  Faxed over to [place] on 08/08/19 with refills as needed     PARoxetine (PAXIL) 10 MG tablet Take one tablet (10 mg dose) by mouth daily. 30 tablet 2   Turmeric 500 MG CAPS Take 1 tablet by mouth every morning.     apixaban (ELIQUIS) 5 MG TABS tablet Take 1 tablet by mouth 2 times daily. (Patient not taking: Reported on 05/29/2022) 60 tablet 3   Current Facility-Administered Medications  Medication Dose Route Frequency Provider Last Rate Last Admin   0.9 %  sodium chloride infusion  500 mL Intravenous Continuous Pyrtle, Lajuan Lines, MD       diclofenac sodium (VOLTAREN) 1 % transdermal gel 2 g  2 g Topical QID Erskine Emery W, PA-C        Allergies  Allergen Reactions   Bee Venom Anaphylaxis   Amlodipine     Feet swelled   Benicar [Olmesartan]     diarrhea   Codeine     REACTION: nausea   Flagyl [Metronidazole]     Nausea and vomiting   Hctz [Hydrochlorothiazide]     itching    Social History   Socioeconomic History   Marital status: Significant Other    Spouse name: Not on file   Number of children: Not on file   Years of education: Not on file   Highest education level: Not on file  Occupational History   Not on file  Tobacco Use   Smoking status: Former    Packs/day: 0.20    Years: 10.00    Total pack years: 2.00    Types: Cigarettes    Quit date: 09/15/1991    Years since quitting: 30.7   Smokeless tobacco: Never   Tobacco comments:    Former smoker 04/17/22  Vaping Use   Vaping Use: Never used  Substance and Sexual Activity    Alcohol use: Yes    Alcohol/week: 4.0 - 6.0 standard drinks of alcohol    Types: 4 - 6 Cans of beer per week    Comment: 2-3 beers 2-3 days a week 04/17/22   Drug use: No   Sexual activity: Never    Partners: Male    Birth control/protection: None  Other Topics Concern   Not on file  Social History Narrative   Not on file   Social Determinants of Health  Financial Resource Strain: Not on file  Food Insecurity: Not on file  Transportation Needs: Not on file  Physical Activity: Not on file  Stress: Not on file  Social Connections: Not on file  Intimate Partner Violence: Not on file     ROS- All systems are reviewed and negative except as per the HPI above.  Physical Exam: Vitals:   05/29/22 0823  BP: (!) 162/76  Pulse: (!) 57  Weight: 75.7 kg  Height: '5\' 6"'$  (1.676 m)     GEN- The patient is a well appearing female, alert and oriented x 3 today.   HEENT-head normocephalic, atraumatic, sclera clear, conjunctiva pink, hearing intact, trachea midline. Lungs- Clear to ausculation bilaterally, normal work of breathing Heart- Regular rate and rhythm, no murmurs, rubs or gallops  GI- soft, NT, ND, + BS Extremities- no clubbing, cyanosis, or edema MS- no significant deformity or atrophy Skin- no rash or lesion Psych- euthymic mood, full affect Neuro- strength and sensation are intact   Wt Readings from Last 3 Encounters:  05/29/22 75.7 kg  04/17/22 73.9 kg  11/11/21 74.4 kg    EKG today demonstrates  SB Vent. rate 57 BPM PR interval 176 ms QRS duration 80 ms QT/QTcB 440/428 ms   Epic records are reviewed at length today  CHA2DS2-VASc Score = 2  The patient's score is based upon: CHF History: 0 HTN History: 1 Diabetes History: 0 Stroke History: 0 Vascular Disease History: 0 Age Score: 0 Gender Score: 1       ASSESSMENT AND PLAN: 1. Paroxysmal Atrial Fibrillation (ICD10:  I48.0) The patient's CHA2DS2-VASc score is 2, indicating a 2.2% annual risk of  stroke.   Patient in [place]. Check echocardiogram  Continue nebivolol 10 mg daily  Apple Watch for home monitoring. She may be a good candidate for front-line ablation  if she has early return of her afib.   2. HTN Elevated today, was actually low at her last visit.  Will not make changes today. Trend over time.    Follow up with Dr Johney Frame as scheduled.    [facility] [address] [place], [place] [address] [phone] 05/29/2022 8:54 AM

## 2022-05-30 ENCOUNTER — Other Ambulatory Visit (HOSPITAL_COMMUNITY): Payer: Self-pay

## 2022-05-30 MED ORDER — PAROXETINE HCL 10 MG PO TABS
ORAL_TABLET | ORAL | 3 refills | Status: DC
  Filled 2022-05-30: qty 90, 90d supply, fill #0
  Filled 2022-08-18: qty 90, 90d supply, fill #1
  Filled 2022-11-15 – 2022-11-27 (×2): qty 90, 90d supply, fill #2
  Filled 2023-02-02 – 2023-02-22 (×2): qty 90, 90d supply, fill #3

## 2022-05-31 ENCOUNTER — Other Ambulatory Visit (HOSPITAL_COMMUNITY): Payer: Self-pay

## 2022-06-01 ENCOUNTER — Other Ambulatory Visit (HOSPITAL_COMMUNITY): Payer: Self-pay

## 2022-06-02 ENCOUNTER — Other Ambulatory Visit (HOSPITAL_COMMUNITY): Payer: Self-pay

## 2022-06-02 ENCOUNTER — Ambulatory Visit (HOSPITAL_COMMUNITY)
Admission: RE | Admit: 2022-06-02 | Discharge: 2022-06-02 | Disposition: A | Discharge status: Home / Self Care | Payer: 59 | Source: Ambulatory Visit | Attending: Physician Assistant | Admitting: Physician Assistant

## 2022-06-02 DIAGNOSIS — I480 Paroxysmal atrial fibrillation: Secondary | ICD-10-CM | POA: Diagnosis present

## 2022-06-05 ENCOUNTER — Encounter (HOSPITAL_COMMUNITY): Payer: Self-pay | Admitting: *Deleted

## 2022-06-05 LAB — ECHOCARDIOGRAM COMPLETE
Area-P 1/2: 2.48 cm2
S' Lateral: 2.4 cm

## 2022-06-06 NOTE — Progress Notes (Deleted)
Cardiology Office Note:    Date:  06/06/2022   ID:  [name] [name], DOB Nov 29, 1960, MRN [id number]  PCP:  Bernerd Limbo, MD   [place] Providers Cardiologist:  None {  Referring MD: Bernerd Limbo, MD    History of Present Illness:    [name] [name] is a 61 y.o. female with a hx of HTN and paroxysmal Afib who presents to clinic for follow-up.   The patient was initially diagnosed with atrial fibrillation in 2020 after presenting to her PCP with symptoms of palpitations. She spontaneously converted while in office. She had a stress echo with Dr Shirlee More which was normal. She had not had any interim afib until she woke 04/16/22 in afib, confirmed by her smart watch. Patient does admit she had more alcohol than usual the night prior at a concert. She took extra doses of BB with minimal improvement. ECG showed afib HR 106. Fortunately, she spontaneously converted later that day.  Was seen in Afib clinic on 05/29/22 where she was doing well. TTE 06/02/22 with LVEF 60-65%, no WMA, normal diastolic function, normal RV, mild LAE, trivial MR, mild dilation of the ascending aorta.  Today. ***  Past Medical History:  Diagnosis Date   Allergic rhinitis, cause unspecified    Extrinsic asthma, unspecified    Hypertension    IBS (irritable bowel syndrome)    Other and unspecified noninfectious gastroenteritis and colitis(558.9)    25 years ago    Past Surgical History:  Procedure Laterality Date   BREAST REDUCTION SURGERY     BUNIONECTOMY  2004   Bil   KNEE ARTHROSCOPY     right knee   rhinoplasty     TOTAL ABDOMINAL HYSTERECTOMY  2004   WISDOM TOOTH EXTRACTION      Current Medications: No outpatient medications have been marked as taking for the 06/10/22 encounter (Appointment) with Freada Bergeron, MD.   Current Facility-Administered Medications for the 06/10/22 encounter (Appointment) with Freada Bergeron, MD  Medication   0.9 %  sodium chloride  infusion   diclofenac sodium (VOLTAREN) 1 % transdermal gel 2 g     Allergies:   Bee venom, Amlodipine, Benicar [olmesartan], Codeine, Flagyl [metronidazole], and Hctz [hydrochlorothiazide]   Social History   Socioeconomic History   Marital status: Significant Other    Spouse name: Not on file   Number of children: Not on file   Years of education: Not on file   Highest education level: Not on file  Occupational History   Not on file  Tobacco Use   Smoking status: Former    Packs/day: 0.20    Years: 10.00    Total pack years: 2.00    Types: Cigarettes    Quit date: 09/15/1991    Years since quitting: 30.7   Smokeless tobacco: Never   Tobacco comments:    Former smoker 04/17/22  Vaping Use   Vaping Use: Never used  Substance and Sexual Activity   Alcohol use: Yes    Alcohol/week: 4.0 - 6.0 standard drinks of alcohol    Types: 4 - 6 Cans of beer per week    Comment: 2-3 beers 2-3 days a week 04/17/22   Drug use: No   Sexual activity: Never    Partners: Male    Birth control/protection: None  Other Topics Concern   Not on file  Social History Narrative   Not on file   Social Determinants of Health   Financial Resource Strain: Not on file  Food Insecurity: Not on file  Transportation Needs: Not on file  Physical Activity: Not on file  Stress: Not on file  Social Connections: Not on file     Family History: The patient's ***family history includes ALS in her mother; Bipolar disorder in an other family member; Colon cancer (age of onset: 66) in her maternal uncle; Coronary artery disease in her father; Hypertension in her father, sister, and another family member.  ROS:   Please see the history of present illness.    *** All other systems reviewed and are negative.  EKGs/Labs/Other Studies Reviewed:    The following studies were reviewed today: TTE 06-17-22: IMPRESSIONS     1. Left ventricular ejection fraction, by estimation, is 60 to 65%. Left   ventricular ejection fraction by 3D volume is 64 %. The left ventricle has  normal function. The left ventricle has no regional wall motion  abnormalities. Left ventricular diastolic   parameters were normal.   2. Right ventricular systolic function is normal. The right ventricular  size is normal. There is normal pulmonary artery systolic pressure. The  estimated right ventricular systolic pressure is 37.8 mmHg.   3. Left atrial size was mildly dilated.   4. The mitral valve is normal in structure. Trivial mitral valve  regurgitation. No evidence of mitral stenosis.   5. The aortic valve is grossly normal. Aortic valve regurgitation is  trivial. No aortic stenosis is present.   6. Aortic dilatation noted. There is mild dilatation of the ascending  aorta, measuring 40 mm.   7. The inferior vena cava is normal in size with greater than 50%  respiratory variability, suggesting right atrial pressure of 3 mmHg.   EKG:  EKG is *** ordered today.  The ekg ordered today demonstrates ***  Recent Labs: No results found for requested labs within last 365 days.  Recent Lipid Panel No results found for: "CHOL", "TRIG", "HDL", "CHOLHDL", "VLDL", "LDLCALC", "LDLDIRECT"   Risk Assessment/Calculations:   {Does this patient have ATRIAL FIBRILLATION?[phone]}  No BP recorded.  {Refresh Note OR Click here to enter BP  :1}***         Physical Exam:    VS:  There were no vitals taken for this visit.    Wt Readings from Last 3 Encounters:  05/29/22 166 lb 12.8 oz (75.7 kg)  04/17/22 163 lb (73.9 kg)  11/11/21 164 lb (74.4 kg)     GEN: *** Well nourished, well developed in no acute distress HEENT: Normal NECK: No JVD; No carotid bruits LYMPHATICS: No lymphadenopathy CARDIAC: ***RRR, no murmurs, rubs, gallops RESPIRATORY:  Clear to auscultation without rales, wheezing or rhonchi  ABDOMEN: Soft, non-tender, non-distended MUSCULOSKELETAL:  No edema; No deformity  SKIN: Warm and  dry NEUROLOGIC:  Alert and oriented x 3 PSYCHIATRIC:  Normal affect   ASSESSMENT:    No diagnosis found. PLAN:    In order of problems listed above:  #Paroxysmal Afib: CHADs-vasc 2. TTE with LVEF 60-65%, no significant valve disease. Has had intermittent episodes since 2020. Has been maintained on nebivolol '10mg'$  daily. Not on [facility] currently but is monitoring for recurrence of apple watch. May be a good candidate for front line ablation. -Continue nebivolol '10mg'$  daily -Could consider front line ablation  #HTN: -Continue lisinopril '10mg'$  daily -Continue nebivolol '10mg'$  daily      {Are you ordering a CV Procedure (e.g. stress test, cath, DCCV, TEE, etc)?   Press F2        :[id number]}  Medication Adjustments/Labs and Tests Ordered: Current medicines are reviewed at length with the patient today.  Concerns regarding medicines are outlined above.  No orders of the defined types were placed in this encounter.  No orders of the defined types were placed in this encounter.   There are no Patient Instructions on file for this visit.   Signed, Freada Bergeron, MD  06/06/2022 8:42 PM    [place]

## 2022-06-10 ENCOUNTER — Ambulatory Visit: Payer: 59 | Attending: Cardiology | Admitting: Cardiology

## 2022-06-15 NOTE — Progress Notes (Unsigned)
Cardiology Office Note:    Date:  06/15/2022   ID:  [name] [name], DOB 1961/06/18, MRN [id number]  PCP:  Bernerd Limbo, MD   [place] Providers Cardiologist:  None {  Referring MD: Bernerd Limbo, MD    History of Present Illness:    [name] [name] is a 61 y.o. female with a hx of HTN and paroxysmal Afib who presents to clinic for follow-up.   The patient was initially diagnosed with atrial fibrillation in 2020 after presenting to her PCP with symptoms of palpitations. She spontaneously converted while in office. She had a stress echo with Dr Shirlee More which was normal. She had not had any interim afib until she woke 04/16/22 in afib, confirmed by her smart watch. Patient does admit she had more alcohol than usual the night prior at a concert. She took extra doses of BB with minimal improvement. ECG showed afib HR 106. Fortunately, she spontaneously converted later that day.  Was seen in Afib clinic on 05/29/22 where she was doing well. TTE 06/02/22 with LVEF 60-65%, no WMA, normal diastolic function, normal RV, mild LAE, trivial MR, mild dilation of the ascending aorta.  Today. ***  Past Medical History:  Diagnosis Date   Allergic rhinitis, cause unspecified    Extrinsic asthma, unspecified    Hypertension    IBS (irritable bowel syndrome)    Other and unspecified noninfectious gastroenteritis and colitis(558.9)    25 years ago    Past Surgical History:  Procedure Laterality Date   BREAST REDUCTION SURGERY     BUNIONECTOMY  2004   Bil   KNEE ARTHROSCOPY     right knee   rhinoplasty     TOTAL ABDOMINAL HYSTERECTOMY  2004   WISDOM TOOTH EXTRACTION      Current Medications: No outpatient medications have been marked as taking for the 06/17/22 encounter (Appointment) with Freada Bergeron, MD.   Current Facility-Administered Medications for the 06/17/22 encounter (Appointment) with Freada Bergeron, MD  Medication   0.9 %  sodium chloride  infusion   diclofenac sodium (VOLTAREN) 1 % transdermal gel 2 g     Allergies:   Bee venom, Amlodipine, Benicar [olmesartan], Codeine, Flagyl [metronidazole], and Hctz [hydrochlorothiazide]   Social History   Socioeconomic History   Marital status: Significant Other    Spouse name: Not on file   Number of children: Not on file   Years of education: Not on file   Highest education level: Not on file  Occupational History   Not on file  Tobacco Use   Smoking status: Former    Packs/day: 0.20    Years: 10.00    Total pack years: 2.00    Types: Cigarettes    Quit date: 09/15/1991    Years since quitting: 30.7   Smokeless tobacco: Never   Tobacco comments:    Former smoker 04/17/22  Vaping Use   Vaping Use: Never used  Substance and Sexual Activity   Alcohol use: Yes    Alcohol/week: 4.0 - 6.0 standard drinks of alcohol    Types: 4 - 6 Cans of beer per week    Comment: 2-3 beers 2-3 days a week 04/17/22   Drug use: No   Sexual activity: Never    Partners: Male    Birth control/protection: None  Other Topics Concern   Not on file  Social History Narrative   Not on file   Social Determinants of Health   Financial Resource Strain: Not on file  Food Insecurity: Not on file  Transportation Needs: Not on file  Physical Activity: Not on file  Stress: Not on file  Social Connections: Not on file     Family History: The patient's ***family history includes ALS in her mother; Bipolar disorder in an other family member; Colon cancer (age of onset: 48) in her maternal uncle; Coronary artery disease in her father; Hypertension in her father, sister, and another family member.  ROS:   Please see the history of present illness.    *** All other systems reviewed and are negative.  EKGs/Labs/Other Studies Reviewed:    The following studies were reviewed today: TTE 2022/06/03: IMPRESSIONS     1. Left ventricular ejection fraction, by estimation, is 60 to 65%. Left   ventricular ejection fraction by 3D volume is 64 %. The left ventricle has  normal function. The left ventricle has no regional wall motion  abnormalities. Left ventricular diastolic   parameters were normal.   2. Right ventricular systolic function is normal. The right ventricular  size is normal. There is normal pulmonary artery systolic pressure. The  estimated right ventricular systolic pressure is 91.7 mmHg.   3. Left atrial size was mildly dilated.   4. The mitral valve is normal in structure. Trivial mitral valve  regurgitation. No evidence of mitral stenosis.   5. The aortic valve is grossly normal. Aortic valve regurgitation is  trivial. No aortic stenosis is present.   6. Aortic dilatation noted. There is mild dilatation of the ascending  aorta, measuring 40 mm.   7. The inferior vena cava is normal in size with greater than 50%  respiratory variability, suggesting right atrial pressure of 3 mmHg.   EKG:  EKG is *** ordered today.  The ekg ordered today demonstrates ***  Recent Labs: No results found for requested labs within last 365 days.  Recent Lipid Panel No results found for: "CHOL", "TRIG", "HDL", "CHOLHDL", "VLDL", "LDLCALC", "LDLDIRECT"   Risk Assessment/Calculations:   {Does this patient have ATRIAL FIBRILLATION?:[phone]}  No BP recorded.  {Refresh Note OR Click here to enter BP  :1}***         Physical Exam:    VS:  There were no vitals taken for this visit.    Wt Readings from Last 3 Encounters:  05/29/22 166 lb 12.8 oz (75.7 kg)  04/17/22 163 lb (73.9 kg)  11/11/21 164 lb (74.4 kg)     GEN: *** Well nourished, well developed in no acute distress HEENT: Normal NECK: No JVD; No carotid bruits LYMPHATICS: No lymphadenopathy CARDIAC: ***RRR, no murmurs, rubs, gallops RESPIRATORY:  Clear to auscultation without rales, wheezing or rhonchi  ABDOMEN: Soft, non-tender, non-distended MUSCULOSKELETAL:  No edema; No deformity  SKIN: Warm and  dry NEUROLOGIC:  Alert and oriented x 3 PSYCHIATRIC:  Normal affect   ASSESSMENT:    No diagnosis found. PLAN:    In order of problems listed above:  #Paroxysmal Afib: CHADs-vasc 2. TTE with LVEF 60-65%, no significant valve disease. Has had intermittent episodes since 2020. Has been maintained on nebivolol '10mg'$  daily. Not on [facility] currently but is monitoring for recurrence of apple watch. May be a good candidate for front line ablation. -Continue nebivolol '10mg'$  daily -Could consider front line ablation  #HTN: -Continue lisinopril '10mg'$  daily -Continue nebivolol '10mg'$  daily      {Are you ordering a CV Procedure (e.g. stress test, cath, DCCV, TEE, etc)?   Press F2        :[id number]}  Medication Adjustments/Labs and Tests Ordered: Current medicines are reviewed at length with the patient today.  Concerns regarding medicines are outlined above.  No orders of the defined types were placed in this encounter.  No orders of the defined types were placed in this encounter.   There are no Patient Instructions on file for this visit.   Signed, Freada Bergeron, MD  06/15/2022 8:18 PM    [place]

## 2022-06-17 ENCOUNTER — Encounter: Payer: Self-pay | Admitting: Cardiology

## 2022-06-17 ENCOUNTER — Ambulatory Visit: Payer: 59 | Attending: Cardiology | Admitting: Cardiology

## 2022-06-17 ENCOUNTER — Ambulatory Visit: Payer: 59 | Admitting: Orthopaedic Surgery

## 2022-06-17 ENCOUNTER — Encounter: Payer: Self-pay | Admitting: Orthopaedic Surgery

## 2022-06-17 VITALS — BP 104/72 | HR 62 | Ht 66.0 in | Wt 162.8 lb

## 2022-06-17 DIAGNOSIS — I10 Essential (primary) hypertension: Secondary | ICD-10-CM

## 2022-06-17 DIAGNOSIS — M1711 Unilateral primary osteoarthritis, right knee: Secondary | ICD-10-CM

## 2022-06-17 DIAGNOSIS — I77819 Aortic ectasia, unspecified site: Secondary | ICD-10-CM | POA: Diagnosis not present

## 2022-06-17 DIAGNOSIS — I480 Paroxysmal atrial fibrillation: Secondary | ICD-10-CM | POA: Diagnosis not present

## 2022-06-17 MED ORDER — SODIUM HYALURONATE (VISCOSUP) 20 MG/2ML IX SOSY
20.0000 mg | PREFILLED_SYRINGE | INTRA_ARTICULAR | Status: AC | PRN
  Administered 2022-06-17: 20 mg via INTRA_ARTICULAR

## 2022-06-17 NOTE — Progress Notes (Signed)
   Procedure Note  Patient: [name] [name]             Date of Birth: 1961-06-18           MRN: [id number]             Visit Date: 06/17/2022  Procedures: Visit Diagnoses:  1. Primary osteoarthritis of right knee     Large Joint Inj: R knee on 06/17/2022 4:57 PM Indications: diagnostic evaluation and pain Details: 22 G 1.5 in needle, superolateral approach  Arthrogram: No  Medications: 20 mg Sodium Hyaluronate (Viscosup) 20 MG/2ML Outcome: tolerated well, no immediate complications Procedure, treatment alternatives, risks and benefits explained, specific risks discussed. Consent was given by the patient. Immediately prior to procedure a time out was called to verify the correct patient, procedure, equipment, support staff and site/side marked as required. Patient was prepped and draped in the usual sterile fashion.    The patient is here for injection number one of the series of 3 hyaluronic acid injections in her right knee to treat the pain from osteoarthritis.  This is with Euflexxa.  She has had a single hyaluronic acid injection before but had an allergic response to that injection.  She does have known well-documented arthritis of the right knee.  She is on aspirin and tumeric as well.  On my exam today there is no knee joint effusion.  I did place injection #1 of a series of 3 Euflexxa injections in her left knee today.  When pulling out the needle I did accidentally scraped the tip of the needle causing a small laceration which was minimal.  I placed a compressive dressing around this.  We will see her back next week for injection #2 of a series of 3 hyaluronic acid injections.  Lot# [id number]

## 2022-06-17 NOTE — Progress Notes (Signed)
Cardiology Office Note:    Date:  06/17/2022   ID:  [name] [name], DOB July 22, 1961, MRN [id number]  PCP:  Bernerd Limbo, MD   [place] Providers Cardiologist:  None {  Referring MD: Bernerd Limbo, MD    History of Present Illness:    [name] [name] is a 61 y.o. female with a hx of HTN and paroxysmal Afib who presents to clinic for follow-up.   The patient was initially diagnosed with atrial fibrillation in 2020 after presenting to her PCP with symptoms of palpitations. She spontaneously converted while in office. She had a stress echo with Dr Shirlee More which was normal. She had not had any interim afib until she woke 04/16/22 in afib, confirmed by her smart watch. Patient does admit she had more alcohol than usual the night prior at a concert. She took extra doses of BB with minimal improvement. ECG showed afib HR 106. Fortunately, she spontaneously converted later that day.  Was seen in Afib clinic on 05/29/22 where she was doing well. TTE 06/02/22 with LVEF 60-65%, no WMA, normal diastolic function, normal RV, mild LAE, trivial MR, mild dilation of the ascending aorta.  Today, the patient states that there have been no recurrent episodes of atrial fibrillation. Of note, she never started her Eliquis, she only remained on 81 mg ASA. She believes Bystolic is working well to manage her heart rates.  Usually she knows immediately that she is in atrial fibrillation as it causes her to feel "Off."  After her most recent episode she had been aware of when she converted back to sinus rhythm.  She otherwise is doing well. BP is well controlled. She has not been aware of any orthostatic symptoms. Wears compression socks for mild LE edema at the end of the day.  She denies any chest pain, or shortness of breath. No lightheadedness, headaches, syncope, orthopnea, or PND.   Past Medical History:  Diagnosis Date   Allergic rhinitis, cause unspecified    Extrinsic asthma,  unspecified    Hypertension    IBS (irritable bowel syndrome)    Other and unspecified noninfectious gastroenteritis and colitis(558.9)    25 years ago    Past Surgical History:  Procedure Laterality Date   BREAST REDUCTION SURGERY     BUNIONECTOMY  2004   Bil   KNEE ARTHROSCOPY     right knee   rhinoplasty     TOTAL ABDOMINAL HYSTERECTOMY  2004   WISDOM TOOTH EXTRACTION      Current Medications: Current Meds  Medication Sig   albuterol (VENTOLIN HFA) 108 (90 Base) MCG/ACT inhaler INHALE TWO PUFFS INTO THE LUNGS EVERY 6 HOURS AS NEEDED FOR WHEEZING OR SHORTNESS OF BREATH.   Albuterol Sulfate 2.5 MG/0.5ML NEBU Inhale into the lungs as needed.   Calcium Carbonate-Vit D-Min 600-400 MG-UNIT TABS Take 1 tablet by mouth daily.     cetirizine (ZYRTEC) 10 MG tablet Take 10 mg by mouth as needed.   EPINEPHrine 0.3 mg/0.3 mL IJ SOAJ injection INJECT 0.3 MLS (0.3 MG DOSE) INTO THE MUSCLE ONCE AS NEEDED FOR ANAPHYLAXIS FOR UP TO ONE DOSE   ergocalciferol (VITAMIN D2) 1.25 MG (50000 UT) capsule Take 1 capsule by mouth weekly for 12 doses.   estradiol (VIVELLE-DOT) 0.05 MG/24HR patch Place 1 patch onto the skin twice a week.   fluticasone-salmeterol (ADVAIR DISKUS) 100-50 MCG/ACT AEPB INHALE ONE PUFF INTO THE LUNGS TWICE A DAY THEN RINSE MOUTH   ibuprofen (ADVIL) 200 MG tablet Take 200  mg by mouth as needed.   lisinopril (ZESTRIL) 10 MG tablet Take 1 tablet by mouth daily.   Misc Natural Products (PROGESTERONE) 1000 MG/60GM CREA Apply topically as needed.   Multiple Vitamin (MULTIVITAMIN) capsule Take 1 capsule by mouth daily.     nebivolol (BYSTOLIC) 10 MG tablet Take 1 tablet by mouth daily.   NON FORMULARY [facility] Scar Cream: Verapamil 10%; Pentoxifylline 5%  Apply 1 to 2 gm to affected areas 3-4x daily prn (100 gm)  - for Plantar Fibromas in arches  Faxed over to [facility] on 08/08/19 with refills as needed   PARoxetine (PAXIL) 10 MG tablet Take one tablet (10 mg  dose) by mouth daily.   Turmeric 500 MG CAPS Take 1 tablet by mouth every morning.   Current Facility-Administered Medications for the 06/17/22 encounter (Office Visit) with Freada Bergeron, MD  Medication   0.9 %  sodium chloride infusion   diclofenac sodium (VOLTAREN) 1 % transdermal gel 2 g     Allergies:   Bee venom, Amlodipine, Benicar [olmesartan], Codeine, Flagyl [metronidazole], and Hctz [hydrochlorothiazide]   Social History   Socioeconomic History   Marital status: Significant Other    Spouse name: Not on file   Number of children: Not on file   Years of education: Not on file   Highest education level: Not on file  Occupational History   Not on file  Tobacco Use   Smoking status: Former    Packs/day: 0.20    Years: 10.00    Total pack years: 2.00    Types: Cigarettes    Quit date: 09/15/1991    Years since quitting: 30.7   Smokeless tobacco: Never   Tobacco comments:    Former smoker 04/17/22  Vaping Use   Vaping Use: Never used  Substance and Sexual Activity   Alcohol use: Yes    Alcohol/week: 4.0 - 6.0 standard drinks of alcohol    Types: 4 - 6 Cans of beer per week    Comment: 2-3 beers 2-3 days a week 04/17/22   Drug use: No   Sexual activity: Never    Partners: Male    Birth control/protection: None  Other Topics Concern   Not on file  Social History Narrative   Not on file   Social Determinants of Health   Financial Resource Strain: Not on file  Food Insecurity: Not on file  Transportation Needs: Not on file  Physical Activity: Not on file  Stress: Not on file  Social Connections: Not on file     Family History: The patient's family history includes ALS in her mother; Bipolar disorder in an other family member; Colon cancer (age of onset: 20) in her maternal uncle; Coronary artery disease in her father; Hypertension in her father, sister, and another family member.  ROS:   Review of Systems  Constitutional:  Negative for chills and  fever.  HENT:  Negative for nosebleeds and tinnitus.   Eyes:  Negative for blurred vision and pain.  Respiratory:  Negative for cough, hemoptysis, shortness of breath and stridor.   Cardiovascular:  Negative for chest pain, palpitations, orthopnea, claudication, leg swelling and PND.  Gastrointestinal:  Negative for blood in stool, diarrhea, nausea and vomiting.  Genitourinary:  Negative for dysuria and hematuria.  Musculoskeletal:  Negative for falls.  Neurological:  Negative for dizziness, loss of consciousness and headaches.  Psychiatric/Behavioral:  Negative for depression, hallucinations and substance abuse. The patient does not have insomnia.  EKGs/Labs/Other Studies Reviewed:    The following studies were reviewed today:  TTE 06/02/22: IMPRESSIONS   1. Left ventricular ejection fraction, by estimation, is 60 to 65%. Left  ventricular ejection fraction by 3D volume is 64 %. The left ventricle has  normal function. The left ventricle has no regional wall motion  abnormalities. Left ventricular diastolic   parameters were normal.   2. Right ventricular systolic function is normal. The right ventricular  size is normal. There is normal pulmonary artery systolic pressure. The  estimated right ventricular systolic pressure is 28.4 mmHg.   3. Left atrial size was mildly dilated.   4. The mitral valve is normal in structure. Trivial mitral valve  regurgitation. No evidence of mitral stenosis.   5. The aortic valve is grossly normal. Aortic valve regurgitation is  trivial. No aortic stenosis is present.   6. Aortic dilatation noted. There is mild dilatation of the ascending  aorta, measuring 40 mm.   7. The inferior vena cava is normal in size with greater than 50%  respiratory variability, suggesting right atrial pressure of 3 mmHg.   EKG:  EKG is personally reviewed. 06/17/2022:  EKG was not ordered.   Recent Labs: No results found for requested labs within last 365 days.    Recent Lipid Panel No results found for: "CHOL", "TRIG", "HDL", "CHOLHDL", "VLDL", "LDLCALC", "LDLDIRECT"   Risk Assessment/Calculations:    CHA2DS2-VASc Score = 2   This indicates a 2.2% annual risk of stroke. The patient's score is based upon: CHF History: 0 HTN History: 1 Diabetes History: 0 Stroke History: 0 Vascular Disease History: 0 Age Score: 0 Gender Score: 1                Physical Exam:    VS:  BP 104/72   Pulse 62   Ht '5\' 6"'$  (1.676 m)   Wt 162 lb 12.8 oz (73.8 kg)   SpO2 97%   BMI 26.28 kg/m     Wt Readings from Last 3 Encounters:  06/17/22 162 lb 12.8 oz (73.8 kg)  05/29/22 166 lb 12.8 oz (75.7 kg)  04/17/22 163 lb (73.9 kg)     GEN: Well nourished, well developed in no acute distress HEENT: Normal NECK: No JVD; No carotid bruits CARDIAC: RRR, no murmurs, rubs, gallops RESPIRATORY:  Clear to auscultation without rales, wheezing or rhonchi  ABDOMEN: Soft, non-tender, non-distended MUSCULOSKELETAL:  No edema; No deformity  SKIN: Warm and dry NEUROLOGIC:  Alert and oriented x 3 PSYCHIATRIC:  Normal affect   ASSESSMENT:    1. Paroxysmal atrial fibrillation (HCC)   2. Aortic dilatation (HCC)   3. Essential hypertension    PLAN:    In order of problems listed above:  #Paroxysmal Afib: CHADs-vasc 2. TTE with LVEF 60-65%, no significant valve disease. Has had intermittent episodes since 2020. Has been maintained on nebivolol '10mg'$  daily. Not on [facility] as patient would prefer to monitor for recurrence on apple watch given infrequent episodes.  May be a good candidate for front line ablation if recurrence -Continue nebivolol '10mg'$  daily -Could consider front line ablation  #HTN: -Continue lisinopril '10mg'$  daily -Continue nebivolol '10mg'$  daily  #Ascending Aorta Dilation: TTE with ascending aorta 4.0cm. -Will continues serial monitoring with yearly echoes -BP well controlled.      Follow-up:  1 year.  Medication Adjustments/Labs and Tests  Ordered: Current medicines are reviewed at length with the patient today.  Concerns regarding medicines are outlined above.   Orders Placed This Encounter  Procedures   ECHOCARDIOGRAM COMPLETE   No orders of the defined types were placed in this encounter.  Patient Instructions  Medication Instructions:   Your physician recommends that you continue on your current medications as directed. Please refer to the Current Medication list given to you today.  *If you need a refill on your cardiac medications before your next appointment, please call your pharmacy*   Testing/Procedures:  Your physician has requested that you have an echocardiogram. Echocardiography is a painless test that uses sound waves to create images of your heart. It provides your doctor with information about the size and shape of your heart and how well your heart's chambers and valves are working. This procedure takes approximately one hour. There are no restrictions for this procedure.  SCHEDULE TO BE DONE IN SEPTEMBER 2024 AND WITH BETHANY TO DO PER DR. Johney Frame   Follow-Up: At [facility], you and your health needs are our priority.  As part of our continuing mission to provide you with exceptional heart care, we have created designated Provider Care Teams.  These Care Teams include your primary Cardiologist (physician) and Advanced Practice Providers (APPs -  Physician Assistants and Nurse Practitioners) who all work together to provide you with the care you need, when you need it.  We recommend signing up for the patient portal called "MyChart".  Sign up information is provided on this After Visit Summary.  MyChart is used to connect with patients for Virtual Visits (Telemedicine).  Patients are able to view lab/test results, encounter notes, upcoming appointments, etc.  Non-urgent messages can be sent to your provider as well.   To learn more about what you can do with MyChart, go to  [email or web address].    Your next appointment:   1 year(s)  The format for your next appointment:   In Person  Provider:   DR. Johney Frame    Important Information About Sugar         I,Mathew Stumpf,acting as a scribe for Freada Bergeron, MD.,have documented all relevant documentation on the behalf of Freada Bergeron, MD,as directed by  Freada Bergeron, MD while in the presence of Freada Bergeron, MD.  I, Freada Bergeron, MD, have reviewed all documentation for this visit. The documentation on 06/17/22 for the exam, diagnosis, procedures, and orders are all accurate and complete.   Signed, Freada Bergeron, MD  06/17/2022 11:11 AM    [place]

## 2022-06-17 NOTE — Patient Instructions (Signed)
Medication Instructions:   Your physician recommends that you continue on your current medications as directed. Please refer to the Current Medication list given to you today.  *If you need a refill on your cardiac medications before your next appointment, please call your pharmacy*   Testing/Procedures:  Your physician has requested that you have an echocardiogram. Echocardiography is a painless test that uses sound waves to create images of your heart. It provides your doctor with information about the size and shape of your heart and how well your heart's chambers and valves are working. This procedure takes approximately one hour. There are no restrictions for this procedure.  SCHEDULE TO BE DONE IN SEPTEMBER 2024 AND WITH BETHANY TO DO PER DR. Johney Frame   Follow-Up: At [facility], you and your health needs are our priority.  As part of our continuing mission to provide you with exceptional heart care, we have created designated Provider Care Teams.  These Care Teams include your primary Cardiologist (physician) and Advanced Practice Providers (APPs -  Physician Assistants and Nurse Practitioners) who all work together to provide you with the care you need, when you need it.  We recommend signing up for the patient portal called "MyChart".  Sign up information is provided on this After Visit Summary.  MyChart is used to connect with patients for Virtual Visits (Telemedicine).  Patients are able to view lab/test results, encounter notes, upcoming appointments, etc.  Non-urgent messages can be sent to your provider as well.   To learn more about what you can do with MyChart, go to [email or web address].    Your next appointment:   1 year(s)  The format for your next appointment:   In Person  Provider:   DR. Johney Frame    Important Information About Sugar

## 2022-06-23 ENCOUNTER — Ambulatory Visit: Payer: 59 | Admitting: Orthopaedic Surgery

## 2022-06-23 ENCOUNTER — Encounter: Payer: Self-pay | Admitting: Orthopaedic Surgery

## 2022-06-23 DIAGNOSIS — M1711 Unilateral primary osteoarthritis, right knee: Secondary | ICD-10-CM

## 2022-06-23 MED ORDER — SODIUM HYALURONATE (VISCOSUP) 20 MG/2ML IX SOSY
20.0000 mg | PREFILLED_SYRINGE | INTRA_ARTICULAR | Status: AC | PRN
  Administered 2022-06-23: 20 mg via INTRA_ARTICULAR

## 2022-06-23 NOTE — Progress Notes (Signed)
   Procedure Note  Patient: [name] [name]             Date of Birth: 1961-05-29           MRN: [id number]             Visit Date: 06/23/2022 HPI: Mrs. Whisner returns today for second Euflexxa injection right knee.  She tolerated the first Euflexxa injection without any adverse effects.  Physical exam: Right knee no abnormal warmth erythema or effusion. Procedures: Visit Diagnoses:  1. Primary osteoarthritis of right knee     Large Joint Inj: R knee on 06/23/2022 10:34 AM Indications: pain Details: 22 G 1.5 in needle, anterolateral approach  Arthrogram: No  Medications: 20 mg Sodium Hyaluronate (Viscosup) 20 MG/2ML Outcome: tolerated well, no immediate complications Procedure, treatment alternatives, risks and benefits explained, specific risks discussed. Consent was given by the patient. Immediately prior to procedure a time out was called to verify the correct patient, procedure, equipment, support staff and site/side marked as required. Patient was prepped and draped in the usual sterile fashion.     Plan: We will see her back in 1 week for her third Euflexxa injection right knee.

## 2022-07-02 ENCOUNTER — Encounter: Payer: Self-pay | Admitting: Physician Assistant

## 2022-07-02 ENCOUNTER — Other Ambulatory Visit (HOSPITAL_COMMUNITY): Payer: Self-pay

## 2022-07-02 ENCOUNTER — Ambulatory Visit: Payer: 59 | Admitting: Physician Assistant

## 2022-07-02 DIAGNOSIS — M1711 Unilateral primary osteoarthritis, right knee: Secondary | ICD-10-CM | POA: Diagnosis not present

## 2022-07-02 MED ORDER — METHYLPREDNISOLONE ACETATE 40 MG/ML IJ SUSP
40.0000 mg | INTRAMUSCULAR | Status: AC | PRN
  Administered 2022-07-02: 40 mg via INTRA_ARTICULAR

## 2022-07-02 MED ORDER — LIDOCAINE HCL 1 % IJ SOLN
3.0000 mL | INTRAMUSCULAR | Status: AC | PRN
  Administered 2022-07-02: 3 mL

## 2022-07-02 MED ORDER — SODIUM HYALURONATE (VISCOSUP) 20 MG/2ML IX SOSY
20.0000 mg | PREFILLED_SYRINGE | INTRA_ARTICULAR | Status: AC | PRN
  Administered 2022-07-02: 20 mg via INTRA_ARTICULAR

## 2022-07-02 MED ORDER — ESTRADIOL 0.05 MG/24HR TD PTTW
MEDICATED_PATCH | TRANSDERMAL | 4 refills | Status: AC
  Filled 2022-07-02: qty 24, 84d supply, fill #0
  Filled 2023-04-06: qty 24, 84d supply, fill #1
  Filled 2023-06-10: qty 24, 84d supply, fill #2
  Filled ????-??-??: fill #2

## 2022-07-02 NOTE — Progress Notes (Signed)
   Procedure Note  Patient: [name] [name]             Date of Birth: [birth year]           MRN: [id number]             Visit Date: 07/02/2022 HPI: Patient returns today for her third Euflexxa injection.  She states that the right knee overall is doing well.  She has had no adverse effects to the injections.  Physical exam: Right knee full extension and full flexion.  No abnormal warmth or erythema.  Procedures: Visit Diagnoses:  1. Primary osteoarthritis of right knee     Large Joint Inj on 07/02/2022 1:16 PM Indications: pain Details: 22 G 1.5 in needle, anterolateral approach  Arthrogram: No  Medications: 3 mL lidocaine 1 %; 40 mg methylPREDNISolone acetate 40 MG/ML; 20 mg Sodium Hyaluronate (Viscosup) 20 MG/2ML Outcome: tolerated well, no immediate complications Procedure, treatment alternatives, risks and benefits explained, specific risks discussed. Consent was given by the patient. Immediately prior to procedure a time out was called to verify the correct patient, procedure, equipment, support staff and site/side marked as required. Patient was prepped and draped in the usual sterile fashion.     Plan: She will follow-up with [place] as needed she needs to wait at least 6 months between injections.

## 2022-07-06 ENCOUNTER — Other Ambulatory Visit (HOSPITAL_COMMUNITY): Payer: Self-pay

## 2022-07-23 NOTE — Progress Notes (Unsigned)
Subjective:    Patient ID: [name] [name], female    DOB: 03/16/1961, 61 y.o.   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 Mostly upper respiratory symptoms, followed by easy fatigue for another week or 2. Now essentially back to baseline.  No hx blood clotting disorder but continues BASA after single episode of PAFib years ago. Feels asthma well-controlled, has meds. CXR reviewed by me pending radiology report. Markings a little prominent. Old calcification mid left chest. No hx TB exposure.  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 Asks Zpak and prednisone taper to hold for trip to [place]. No new concerns. Cardiology follows for PAFib. CXR 11/12/21- IMPRESSION: No active cardiopulmonary disease.   ROS-see HPI + = positive Constitutional:   No-   weight loss, night sweats, fevers, chills, fatigue, lassitude. HEENT:   No-  headaches, difficulty swallowing, tooth/dental problems, sore throat,       No-  sneezing, itching, ear ache, nasal congestion, post nasal drip,  CV:  No-   chest pain, orthopnea, PND, swelling in lower extremities, anasarca, dizziness, +palpitations Resp:  No-   shortness of breath with exertion or at rest.          productive cough,  No non-productive cough,  No- coughing up of blood.             change in color of mucus.  No- wheezing.   Skin: No-   rash or lesions. GI:  No-   heartburn, indigestion, abdominal pain, nausea, vomiting, GU: . MS:  No-   joint pain or swelling.   Neuro-     nothing unusual Psych:  No- change in mood or affect. No depression or anxiety.  No memory loss.   Objective:   Physical Exam General- Alert, Oriented, Affect-appropriate, Distress- none acute   Skin- rash-none, lesions- none, excoriation- none Lymphadenopathy- none Head- atraumatic            Eyes- Gross vision intact, PERRLA, conjunctivae clear secretions            Ears- Hearing, canals normal            Nose- Clear, Mild- Septal dev, narrower on left,    No- mucus, polyps, erosion, perforation             Throat- Mallampati II , mucosa clear , drainage- none, tonsils- atrophic Neck- flexible , trachea midline, no stridor , thyroid nl, carotid no bruit Chest - symmetrical excursion , unlabored           Heart/CV- RRR , no murmur , no gallop  , no rub, nl s1 s2                           - JVD- none , edema- none, stasis changes- none, varices- none  Lung- clear to P&A, wheeze- none, cough- none, dullness-none, rub- none           Chest wall- ?slight kyphosis Abd-  Br/ Gen/ Rectal- Not done, not indicated Extrem- cyanosis- none, clubbing, none, atrophy- none, strength- nl Neuro- grossly intact to observation   Assessment & Plan:

## 2022-07-24 ENCOUNTER — Encounter: Payer: Self-pay | Admitting: Internal Medicine

## 2022-07-24 ENCOUNTER — Other Ambulatory Visit (HOSPITAL_COMMUNITY): Payer: Self-pay

## 2022-07-24 ENCOUNTER — Ambulatory Visit: Payer: 59 | Admitting: Internal Medicine

## 2022-07-24 VITALS — BP 124/64 | HR 69 | Ht 66.0 in | Wt 166.6 lb

## 2022-07-24 DIAGNOSIS — D869 Sarcoidosis, unspecified: Secondary | ICD-10-CM | POA: Diagnosis not present

## 2022-07-24 DIAGNOSIS — I270 Primary pulmonary hypertension: Secondary | ICD-10-CM

## 2022-07-24 DIAGNOSIS — J4520 Mild intermittent asthma, uncomplicated: Secondary | ICD-10-CM

## 2022-07-24 DIAGNOSIS — I480 Paroxysmal atrial fibrillation: Secondary | ICD-10-CM | POA: Diagnosis not present

## 2022-07-24 MED ORDER — AZITHROMYCIN 250 MG PO TABS
ORAL_TABLET | ORAL | 0 refills | Status: DC
  Filled 2022-07-24: qty 6, 5d supply, fill #0

## 2022-07-24 MED ORDER — PREDNISONE 10 MG PO TABS
10.0000 mg | ORAL_TABLET | Freq: Every day | ORAL | 0 refills | Status: DC
  Filled 2022-07-24: qty 20, 20d supply, fill #0

## 2022-07-24 MED ORDER — ALBUTEROL SULFATE HFA 108 (90 BASE) MCG/ACT IN AERS
INHALATION_SPRAY | RESPIRATORY_TRACT | 12 refills | Status: DC
  Filled 2022-07-24: qty 6.7, 25d supply, fill #0
  Filled 2023-04-28: qty 6.7, 25d supply, fill #1

## 2022-07-24 NOTE — Patient Instructions (Signed)
Albuterol refilled   Scripts sent for prednisone and Zpak to carry for travel

## 2022-07-28 ENCOUNTER — Encounter: Payer: Self-pay | Admitting: Internal Medicine

## 2022-07-28 NOTE — Assessment & Plan Note (Signed)
Followed by cardiology. Her inhalers do not seem to aggravate this.

## 2022-07-28 NOTE — Assessment & Plan Note (Signed)
Mild intermittent uncomplicated Plan- continue current meds. ZPak and prednisone to hold for travel to [place]

## 2022-08-18 ENCOUNTER — Other Ambulatory Visit: Payer: Self-pay

## 2022-08-18 ENCOUNTER — Other Ambulatory Visit (HOSPITAL_COMMUNITY): Payer: Self-pay

## 2022-08-25 ENCOUNTER — Encounter: Payer: Self-pay | Admitting: Internal Medicine

## 2022-08-30 ENCOUNTER — Other Ambulatory Visit (HOSPITAL_COMMUNITY): Payer: Self-pay

## 2022-08-31 ENCOUNTER — Other Ambulatory Visit (HOSPITAL_COMMUNITY): Payer: Self-pay

## 2022-08-31 MED ORDER — VITAMIN D (ERGOCALCIFEROL) 1.25 MG (50000 UNIT) PO CAPS
50000.0000 [IU] | ORAL_CAPSULE | ORAL | 1 refills | Status: DC
  Filled 2022-08-31 – 2022-09-29 (×2): qty 12, 84d supply, fill #0
  Filled 2023-01-13: qty 12, 84d supply, fill #1

## 2022-09-01 ENCOUNTER — Other Ambulatory Visit (HOSPITAL_COMMUNITY): Payer: Self-pay

## 2022-09-09 ENCOUNTER — Other Ambulatory Visit (HOSPITAL_COMMUNITY): Payer: Self-pay

## 2022-09-18 DIAGNOSIS — D1801 Hemangioma of skin and subcutaneous tissue: Secondary | ICD-10-CM | POA: Diagnosis not present

## 2022-09-18 DIAGNOSIS — R233 Spontaneous ecchymoses: Secondary | ICD-10-CM | POA: Diagnosis not present

## 2022-09-18 DIAGNOSIS — D229 Melanocytic nevi, unspecified: Secondary | ICD-10-CM | POA: Diagnosis not present

## 2022-09-20 ENCOUNTER — Telehealth: Payer: Commercial Managed Care - PPO | Admitting: Family Medicine

## 2022-09-20 ENCOUNTER — Telehealth: Payer: Commercial Managed Care - PPO | Admitting: Family

## 2022-09-20 DIAGNOSIS — U071 COVID-19: Secondary | ICD-10-CM

## 2022-09-20 MED ORDER — MOLNUPIRAVIR EUA 200MG CAPSULE: 4.0000 | ORAL_CAPSULE | Freq: Two times a day (BID) | ORAL | 0 refills | Status: AC

## 2022-09-20 MED ORDER — BENZONATATE 100 MG PO CAPS: 100.0000 mg | ORAL_CAPSULE | Freq: Three times a day (TID) | ORAL | 0 refills | Status: DC | PRN

## 2022-09-20 NOTE — Progress Notes (Signed)
   Thank you for the details you included in the comment boxes. Those details are very helpful in determining the best course of treatment for you and help [place] to provide the best care.Because Ms. [name] [name], we recommend that you convert this visit to a video visit in order for the provider to better assess what is going on.  The provider will be able to give you a more accurate diagnosis and treatment plan if we can more freely discuss your symptoms and with the addition of a virtual examination.   If you convert to a video visit, we will bill your insurance (similar to an office visit) and you will not be charged for this e-Visit. You will be able to stay at home and speak with the first available [facility] advanced practice provider. The link to do a video visit is in the drop down Menu tab of your Welcome screen in [place].     have provided 5 minutes of non face to face time during this encounter for chart review and documentation.

## 2022-09-20 NOTE — Progress Notes (Signed)
Virtual Visit Consent   [name] [name], you are scheduled for a virtual visit with a [facility] provider today. Just as with appointments in the office, your consent must be obtained to participate. Your consent will be active for this visit and any virtual visit you may have with one of our providers in the next 365 days. If you have a MyChart account, a copy of this consent can be sent to you electronically.  As this is a virtual visit, video technology does not allow for your provider to perform a traditional examination. This may limit your provider's ability to fully assess your condition. If your provider identifies any concerns that need to be evaluated in person or the need to arrange testing (such as labs, EKG, etc.), we will make arrangements to do so. Although advances in technology are sophisticated, we cannot ensure that it will always work on either your end or our end. If the connection with a video visit is poor, the visit may have to be switched to a telephone visit. With either a video or telephone visit, we are not always able to ensure that we have a secure connection.  By engaging in this virtual visit, you consent to the provision of healthcare and authorize for your insurance to be billed (if applicable) for the services provided during this visit. Depending on your insurance coverage, you may receive a charge related to this service.  I need to obtain your verbal consent now. Are you willing to proceed with your visit today? [name] [name] has provided verbal consent on 09/20/2022 for a virtual visit (video or telephone). Evelina Dun, FNP  Date: 09/20/2022 3:58 PM  Virtual Visit via Video Note   I, Evelina Dun, connected with  [name] [name]  ([id number], [birth year]) on 09/20/22 at  4:00 PM EST by a video-enabled telemedicine application and verified that I am speaking with the correct person using two identifiers.  Location: Patient: Virtual Visit Location  Patient: Home Provider: Virtual Visit Location Provider: Home Office   I discussed the limitations of evaluation and management by telemedicine and the availability of in person appointments. The patient expressed understanding and agreed to proceed.    History of Present Illness: [name] [name] is a 62 y.o. who identifies as a female who was assigned female at birth, and is being seen today for COVID. Reports her symptoms started yesterday.   HPI: URI  This is a new problem. The current episode started yesterday. The problem has been unchanged. There has been no fever. Associated symptoms include congestion, coughing, ear pain, headaches, joint pain, sinus pain, sneezing and a sore throat. Pertinent negatives include no rash. She has tried acetaminophen and decongestant for the symptoms. The treatment provided mild relief.    Problems:  Patient Active Problem List   Diagnosis Date Noted   Paroxysmal atrial fibrillation ([place]) 05/29/2022   COVID-19 virus infection 11/11/2021   Pain of ulnar side of wrist 10/27/2021   History of atrial fibrillation 05/28/2020   Pain in right knee 02/09/2020   Asthma, mild intermittent 06/21/2019   Vitamin D insufficiency 04/11/2019   History of basal cell carcinoma (BCC) 12/06/2017   Menopausal syndrome 07/25/2017   Primary osteoarthritis of right knee 01/15/2017   Face lesion 05/11/2016   Intrinsic eczema 03/05/2016   Hypertriglyceridemia 05/14/2015   Anaphylaxis due to insect venom 08/20/2014   Annual physical exam 08/20/2014   Essential hypertension 12/06/2013   Hx of abnormal Pap smear 01/15/2012  Perimenopausal vasomotor symptoms 01/15/2012   S/P hysterectomy 01/15/2012   OTITIS MEDIA, RIGHT 04/26/2008   Seasonal allergic rhinitis 04/26/2008   Asthmatic bronchitis with acute exacerbation 04/26/2008   COLITIS 04/26/2008    Allergies:  Allergies  Allergen Reactions   Bee Venom Anaphylaxis   Amlodipine     Feet swelled   Benicar  [Olmesartan]     diarrhea   Codeine     REACTION: nausea   Flagyl [Metronidazole]     Nausea and vomiting   Hctz [Hydrochlorothiazide]     itching   Medications:  Current Outpatient Medications:    benzonatate (TESSALON PERLES) 100 MG capsule, Take 1 capsule (100 mg total) by mouth 3 (three) times daily as needed., Disp: 20 capsule, Rfl: 0   albuterol (VENTOLIN HFA) 108 (90 Base) MCG/ACT inhaler, Inhale 2 puffs into the lungs every 6 hours as needed for wheezing or shortness of breath., Disp: 6.7 g, Rfl: 12   Albuterol Sulfate 2.5 MG/0.5ML NEBU, Inhale into the lungs as needed., Disp: , Rfl:    azithromycin (ZITHROMAX) 250 MG tablet, Take 2  tablets on day one, then one daily for 4 days., Disp: 6 tablet, Rfl: 0   cetirizine (ZYRTEC) 10 MG tablet, Take 10 mg by mouth as needed., Disp: , Rfl:    EPINEPHrine 0.3 mg/0.3 mL IJ SOAJ injection, INJECT 0.3 MLS (0.3 MG DOSE) INTO THE MUSCLE ONCE AS NEEDED FOR ANAPHYLAXIS FOR UP TO ONE DOSE, Disp: 2 each, Rfl: 0   estradiol (VIVELLE-DOT) 0.05 MG/24HR patch, APPLY ONE PATCH TO SKIN TWICE A WEEK, Disp: 24 patch, Rfl: 4   fluticasone-salmeterol (ADVAIR DISKUS) 100-50 MCG/ACT AEPB, INHALE ONE PUFF INTO THE LUNGS TWICE A DAY THEN RINSE MOUTH, Disp: 60 each, Rfl: 6   ibuprofen (ADVIL) 200 MG tablet, Take 200 mg by mouth as needed., Disp: , Rfl:    lisinopril (ZESTRIL) 10 MG tablet, Take 1 tablet by mouth daily., Disp: 90 tablet, Rfl: 10   Misc Natural Products (PROGESTERONE) 1000 MG/60GM CREA, Apply topically as needed., Disp: , Rfl:    molnupiravir EUA (LAGEVRIO) 200 mg CAPS capsule, Take 4 capsules (800 mg total) by mouth 2 (two) times daily for 5 days., Disp: 40 capsule, Rfl: 0   Multiple Vitamin (MULTIVITAMIN) capsule, Take 1 capsule by mouth daily.  , Disp: , Rfl:    nebivolol (BYSTOLIC) 10 MG tablet, Take 1 tablet by mouth daily., Disp: 90 tablet, Rfl: 10   NON FORMULARY, [facility] Scar Cream: Verapamil 10%; Pentoxifylline 5%  Apply 1 to 2  gm to affected areas 3-4x daily prn (100 gm)  - for Plantar Fibromas in arches  Faxed over to [facility] on 08/08/19 with refills as needed, Disp: , Rfl:    PARoxetine (PAXIL) 10 MG tablet, Take one tablet (10 mg dose) by mouth daily., Disp: 90 tablet, Rfl: 3   predniSONE (DELTASONE) 10 MG tablet, Take 1 tablet (10 mg total) by mouth daily with breakfast., Disp: 20 tablet, Rfl: 0   Turmeric 500 MG CAPS, Take 1 tablet by mouth every morning. (Patient not taking: Reported on 07/24/2022), Disp: , Rfl:    Vitamin D, Ergocalciferol, (DRISDOL) 1.25 MG (50000 UNIT) CAPS capsule, Take 1 capsule (50,000 Units total) by mouth once a week., Disp: 12 capsule, Rfl: 1  Current Facility-Administered Medications:    0.9 %  sodium chloride infusion, 500 mL, Intravenous, Continuous, Pyrtle, Lajuan Lines, MD   diclofenac sodium (VOLTAREN) 1 % transdermal gel 2 g, 2 g, Topical, QID, Clark,  Gillermo Murdoch, PA-C  Observations/Objective: Patient is well-developed, well-nourished in no acute distress.  Resting comfortably  at home.  Head is normocephalic, atraumatic.  No labored breathing.  Speech is clear and coherent with logical content.  Patient is alert and oriented at baseline.  Nasal congestion  Assessment and Plan: 1. COVID-19 virus infection - molnupiravir EUA (LAGEVRIO) 200 mg CAPS capsule; Take 4 capsules (800 mg total) by mouth 2 (two) times daily for 5 days.  Dispense: 40 capsule; Refill: 0 - benzonatate (TESSALON PERLES) 100 MG capsule; Take 1 capsule (100 mg total) by mouth 3 (three) times daily as needed.  Dispense: 20 capsule; Refill: 0  COVID positive, rest, force fluids, tylenol as needed, Quarantine for at least 5 days and you are fever free, then must wear a mask out in public from day 1-74, report any worsening symptoms such as increased shortness of breath, swelling, or continued high fevers. Possible adverse effects discussed with antivirals.    Follow Up Instructions: I discussed the  assessment and treatment plan with the patient. The patient was provided an opportunity to ask questions and all were answered. The patient agreed with the plan and demonstrated an understanding of the instructions.  A copy of instructions were sent to the patient via MyChart unless otherwise noted below.     The patient was advised to call back or seek an in-person evaluation if the symptoms worsen or if the condition fails to improve as anticipated.  Time:  I spent 7 minutes with the patient via telehealth technology discussing the above problems/concerns.    Evelina Dun, FNP

## 2022-09-28 ENCOUNTER — Ambulatory Visit (INDEPENDENT_AMBULATORY_CARE_PROVIDER_SITE_OTHER): Payer: Commercial Managed Care - PPO | Admitting: Physician Assistant

## 2022-09-28 ENCOUNTER — Encounter: Payer: Self-pay | Admitting: Physician Assistant

## 2022-09-28 DIAGNOSIS — M25561 Pain in right knee: Secondary | ICD-10-CM | POA: Diagnosis not present

## 2022-09-28 MED ORDER — LIDOCAINE HCL 1 % IJ SOLN
5.0000 mL | INTRAMUSCULAR | Status: AC | PRN
  Administered 2022-09-28: 5 mL

## 2022-09-28 MED ORDER — METHYLPREDNISOLONE ACETATE 40 MG/ML IJ SUSP
40.0000 mg | INTRAMUSCULAR | Status: AC | PRN
  Administered 2022-09-28: 40 mg via INTRA_ARTICULAR

## 2022-09-28 NOTE — Progress Notes (Signed)
HPI: [name] comes in today requesting right knee injection with cortisone.  She finished Euflexxa 3 months ago.  She has a planned trip to [place] next week.  Last night she taken her dog out when she missed a step and fell onto her knee.  She states she did not strike kneecap and fell on the proximal tibia over the tibial tubercle where she has slight abrasion.  She is able to ambulate on the leg.  She defers any radiographs today.  She has known osteoarthritis involving the left left knee involving all 3 compartments but mostly involving the lateral compartment where she has grade 4 chondromalacia involving the lateral femoral condyle and tibial plateau.  Review of systems: See HPI otherwise negative  Physical exam: General well-developed well-nourished female who ambulates without any assistive devices nonantalgic gait. Right knee: Good range of motion right knee.  Nontender about the patella.  No instability valgus varus stress in the knee.  Slight abrasion over the tibial tubercle region but no drainage.  No signs of gross infection.     Procedure Note  Procedures: Visit Diagnoses: No diagnosis found.  Large Joint Inj on 09/28/2022 5:20 PM Indications: pain Details: 22 G 1.5 in needle, anterolateral approach  Arthrogram: No  Medications: 5 mL lidocaine 1 %; 40 mg methylPREDNISolone acetate 40 MG/ML Aspirate: 23 mL bloody Outcome: tolerated well, no immediate complications Procedure, treatment alternatives, risks and benefits explained, specific risks discussed. Consent was given by the patient. Immediately prior to procedure a time out was called to verify the correct patient, procedure, equipment, support staff and site/side marked as required. Patient was prepped and draped in the usual sterile fashion.     Plan: She will follow-up with [place] as needed.  Questions encouraged and answered at length.

## 2022-09-29 ENCOUNTER — Other Ambulatory Visit (HOSPITAL_COMMUNITY): Payer: Self-pay

## 2022-10-05 ENCOUNTER — Other Ambulatory Visit (HOSPITAL_COMMUNITY): Payer: Self-pay

## 2022-10-05 ENCOUNTER — Telehealth: Payer: Self-pay | Admitting: *Deleted

## 2022-10-05 ENCOUNTER — Other Ambulatory Visit: Payer: Self-pay | Admitting: Cardiology

## 2022-10-05 DIAGNOSIS — I77819 Aortic ectasia, unspecified site: Secondary | ICD-10-CM

## 2022-10-05 DIAGNOSIS — I480 Paroxysmal atrial fibrillation: Secondary | ICD-10-CM

## 2022-10-05 DIAGNOSIS — I10 Essential (primary) hypertension: Secondary | ICD-10-CM

## 2022-10-05 DIAGNOSIS — Z79899 Other long term (current) drug therapy: Secondary | ICD-10-CM

## 2022-10-05 MED ORDER — LOSARTAN POTASSIUM 50 MG PO TABS
50.0000 mg | ORAL_TABLET | Freq: Every day | ORAL | 3 refills | Status: DC
  Filled 2022-10-05: qty 90, 90d supply, fill #0

## 2022-10-05 NOTE — Telephone Encounter (Signed)
Dr. Maudie Mercury for the pt to get her bmet done on 2/7, when she returns to the country.  Pt aware of this.

## 2022-10-05 NOTE — Telephone Encounter (Signed)
New orders from Dr. Johney Frame via secure chat to stop the pts lisinopril and start her on losartan 50 mg po daily with a BMET to be done in one week.  Dr. Johney Frame sent in her losartan 50 mg to her pharmacy of choice.  Pt is aware she will need to stop lisinopril and have a 48 hr washout period before starting losartan.  She is aware to have a repeat BMET in one week.  Pt states she cannot come in for a bmet in one week, for she will be in [place] on vacation and will not be able to get this done until 10/21/22.  Made Dr. Johney Frame aware of this via secure chat.  Pt aware that if waiting this long for a bmet is not advised by Dr. Johney Frame, we will let her know, but for now we will plan to check a bmet on her when she returns to the country on 10/21/22.  Pt agreed with this plan.  Pt verbalized understanding and agrees with this plan.

## 2022-10-06 ENCOUNTER — Other Ambulatory Visit (HOSPITAL_COMMUNITY): Payer: Self-pay

## 2022-10-07 ENCOUNTER — Other Ambulatory Visit (HOSPITAL_COMMUNITY): Payer: Self-pay

## 2022-10-07 ENCOUNTER — Telehealth: Payer: Self-pay | Admitting: *Deleted

## 2022-10-07 ENCOUNTER — Ambulatory Visit (AMBULATORY_SURGERY_CENTER): Payer: Commercial Managed Care - PPO | Admitting: *Deleted

## 2022-10-07 VITALS — Ht 66.0 in | Wt 160.0 lb

## 2022-10-07 DIAGNOSIS — Z86010 Personal history of colonic polyps: Secondary | ICD-10-CM

## 2022-10-07 DIAGNOSIS — Z800 Family history of malignant neoplasm of digestive organs: Secondary | ICD-10-CM

## 2022-10-07 MED ORDER — NA SULFATE-K SULFATE-MG SULF 17.5-3.13-1.6 GM/177ML PO SOLN
1.0000 | Freq: Once | ORAL | 0 refills | Status: AC
  Filled 2022-10-07: qty 354, 1d supply, fill #0

## 2022-10-07 NOTE — Telephone Encounter (Signed)
Can add EGD for chronic cough and LPR symptoms Will need precert

## 2022-10-07 NOTE — Telephone Encounter (Signed)
Dr.Pyrtle [name] [name]. Works as a [employer] scientist with [employer] and her schedule was already out for the month of March,she will call back to reschedule both procedures for the month of APRIL,she wants to do both procedures at the same time.procedure for 10/30/22 was cancelled.

## 2022-10-07 NOTE — Telephone Encounter (Signed)
Pt. Pre-visit today she requested that I ask you if she could have a EGD on the day of her colonoscopy 10/30/22,she has a occasional cough that has improved since she has been taken off her lisinopril which she just stopped about a week ago,she stated "my throat do get irritated at the back,her cardiologist mentioned to her,this week if she could ask you to take a look  to make sure nothing is structurally wrong,please advise,I did tell her that  the procedure date may change depending on your recommendations and schedule please advise

## 2022-10-07 NOTE — Progress Notes (Signed)
No egg or soy allergy known to patient  No issues known to pt with past sedation with any surgeries or procedures Patient denies ever being told they had issues or difficulty with intubation  No FH of Malignant Hyperthermia Pt is not on diet pills Pt is not on  home 02  Pt is not on blood thinners  Pt denies issues with constipation  H/O A fib LAST EPISODE AUGUST 2023,NO A FLUTTER Have any cardiac testing pending--NO Pt instructed to use Singlecare.com or GoodRx for a price reduction on prep    Patient's chart reviewed by Osvaldo Angst CNRA prior to previsit and patient appropriate for the [facility].  Previsit completed and red dot placed by patient's name on their procedure day (on provider's schedule).

## 2022-10-19 ENCOUNTER — Encounter: Payer: Self-pay | Admitting: Internal Medicine

## 2022-10-19 ENCOUNTER — Other Ambulatory Visit: Payer: Self-pay | Admitting: Cardiology

## 2022-10-19 ENCOUNTER — Other Ambulatory Visit (HOSPITAL_COMMUNITY): Payer: Self-pay

## 2022-10-19 MED ORDER — IRBESARTAN 300 MG PO TABS
300.0000 mg | ORAL_TABLET | Freq: Every day | ORAL | 11 refills | Status: DC
  Filled 2022-10-19: qty 30, 30d supply, fill #0

## 2022-10-21 ENCOUNTER — Other Ambulatory Visit: Payer: Commercial Managed Care - PPO

## 2022-10-26 ENCOUNTER — Other Ambulatory Visit (HOSPITAL_COMMUNITY): Payer: Self-pay

## 2022-10-26 ENCOUNTER — Other Ambulatory Visit: Payer: Self-pay | Admitting: Cardiology

## 2022-10-26 MED ORDER — SPIRONOLACTONE 25 MG PO TABS
25.0000 mg | ORAL_TABLET | Freq: Every day | ORAL | 11 refills | Status: DC
  Filled 2022-10-26: qty 30, 30d supply, fill #0

## 2022-10-28 ENCOUNTER — Other Ambulatory Visit: Payer: Commercial Managed Care - PPO

## 2022-10-30 ENCOUNTER — Encounter: Payer: Self-pay | Admitting: Internal Medicine

## 2022-11-03 ENCOUNTER — Ambulatory Visit: Payer: Commercial Managed Care - PPO | Attending: Cardiology

## 2022-11-03 DIAGNOSIS — I480 Paroxysmal atrial fibrillation: Secondary | ICD-10-CM

## 2022-11-03 DIAGNOSIS — I10 Essential (primary) hypertension: Secondary | ICD-10-CM

## 2022-11-03 DIAGNOSIS — Z79899 Other long term (current) drug therapy: Secondary | ICD-10-CM | POA: Diagnosis not present

## 2022-11-03 DIAGNOSIS — I77819 Aortic ectasia, unspecified site: Secondary | ICD-10-CM | POA: Diagnosis not present

## 2022-11-03 LAB — BASIC METABOLIC PANEL
BUN/Creatinine Ratio: 18 (ref 12–28)
BUN: 14 mg/dL (ref 8–27)
CO2: 24 mmol/L (ref 20–29)
Calcium: 9.8 mg/dL (ref 8.7–10.3)
Chloride: 97 mmol/L (ref 96–106)
Creatinine, Ser: 0.79 mg/dL (ref 0.57–1.00)
Glucose: 106 mg/dL — ABNORMAL HIGH (ref 70–99)
Potassium: 4.9 mmol/L (ref 3.5–5.2)
Sodium: 137 mmol/L (ref 134–144)
eGFR: 85 mL/min/{1.73_m2} (ref >59)

## 2022-11-05 ENCOUNTER — Other Ambulatory Visit (HOSPITAL_COMMUNITY): Payer: Self-pay

## 2022-11-05 ENCOUNTER — Telehealth: Payer: Self-pay | Admitting: *Deleted

## 2022-11-05 MED ORDER — AMLODIPINE BESYLATE 2.5 MG PO TABS
2.5000 mg | ORAL_TABLET | Freq: Every day | ORAL | 1 refills | Status: DC
  Filled 2022-11-05: qty 90, 90d supply, fill #0
  Filled 2023-01-13 – 2023-01-18 (×2): qty 90, 90d supply, fill #1

## 2022-11-05 MED ORDER — NEBIVOLOL HCL 20 MG PO TABS
20.0000 mg | ORAL_TABLET | Freq: Every day | ORAL | 1 refills | Status: DC
  Filled 2022-11-05: qty 90, 90d supply, fill #0
  Filled 2023-01-13 – 2023-01-18 (×2): qty 90, 90d supply, fill #1

## 2022-11-05 NOTE — Telephone Encounter (Signed)
Pt called to ask Dr. Johney Frame if she should continue taking bystolic 20 mg po daily or reduce to the 10 mg tablet, and whichever she prefers, she will need a refill of this medication sent to her pharmacy.  Spoke with Dr. Johney Frame about this and she advised that the pt should stay on Bystolic 20 mg po daily and she wants to add low dose amlodipine 2.5 mg po daily to her regimen.  Pt made aware of this.    Confirmed the pharmacy of choice with the pt.   Pt verbalized understanding and agrees with this plan.

## 2022-11-09 ENCOUNTER — Other Ambulatory Visit: Payer: Self-pay | Admitting: *Deleted

## 2022-11-09 ENCOUNTER — Other Ambulatory Visit (HOSPITAL_COMMUNITY): Payer: Self-pay

## 2022-11-09 MED ORDER — SPIRONOLACTONE 100 MG PO TABS
100.0000 mg | ORAL_TABLET | Freq: Every day | ORAL | 2 refills | Status: DC
  Filled 2022-11-09: qty 90, 90d supply, fill #0

## 2022-11-09 NOTE — Progress Notes (Signed)
Dr. Johney Frame advised the pt to increase her spironolactone to 100 mg po daily a couple weeks ago.  Pt is requesting further refills of this to be sent to her pharmacy of choice.   Sent in rx for pt.  Pt aware and was gracious for all the assistance provided.  Pt states her BPs are finally at goal now.

## 2022-11-11 ENCOUNTER — Telehealth: Payer: Self-pay

## 2022-11-11 DIAGNOSIS — N760 Acute vaginitis: Secondary | ICD-10-CM | POA: Insufficient documentation

## 2022-11-11 NOTE — Telephone Encounter (Signed)
Ok to use that spot for upper and lower (if that is what was intended initially) Thanks for catching JMP

## 2022-11-11 NOTE — Telephone Encounter (Signed)
Hey Dr. Norman Herrlich. I am prepping this patient chart. It looks like per tele encounters she is suppose was suppose to be rescheduled for a endo/colon in April. It looks like a mistake may have been made on schedulers end and the pt is only down for EGD. There is a break in your schedule on 12-24-22 from 10:00-10:30 the pt is currently scheduled for 10:30 AM and you already have a double that morning. I am not sure if you would be willing to do her procedure from 10:00-11:00 or would you like me to see if she can come in later that day. Please advise?

## 2022-11-18 ENCOUNTER — Encounter: Payer: Self-pay | Admitting: Internal Medicine

## 2022-11-19 ENCOUNTER — Encounter: Payer: Self-pay | Admitting: Radiology

## 2022-11-23 ENCOUNTER — Other Ambulatory Visit (HOSPITAL_COMMUNITY): Payer: Self-pay

## 2022-11-26 ENCOUNTER — Ambulatory Visit (AMBULATORY_SURGERY_CENTER): Payer: Commercial Managed Care - PPO | Admitting: *Deleted

## 2022-11-26 VITALS — Ht 66.0 in | Wt 160.0 lb

## 2022-11-26 DIAGNOSIS — Z86010 Personal history of colonic polyps: Secondary | ICD-10-CM

## 2022-11-26 NOTE — Progress Notes (Signed)
No egg or soy allergy known to patient  No issues known to pt with past sedation with any surgeries or procedures Patient denies ever being told they had issues or difficulty with intubation  No FH of Malignant Hyperthermia Pt is not on diet pills Pt is not on  home 02  Pt is not on blood thinners  Pt denies issues with constipation  Pt is not on dialysis Pt denies any upcoming cardiac testing Pt encouraged to use to use Singlecare or Goodrx to reduce cost  Patient's chart reviewed by Osvaldo Angst CNRA prior to previsit and patient appropriate for the [facility].  Previsit completed and red dot placed by patient's name on their procedure day (on provider's schedule).   Visit by phone Pt sates weight is 160 lb. Instructions sent by mail and my chart

## 2022-11-27 ENCOUNTER — Other Ambulatory Visit (HOSPITAL_COMMUNITY): Payer: Self-pay

## 2022-11-28 ENCOUNTER — Other Ambulatory Visit (HOSPITAL_COMMUNITY): Payer: Self-pay

## 2022-11-30 ENCOUNTER — Encounter: Payer: Self-pay | Admitting: Internal Medicine

## 2022-12-01 ENCOUNTER — Other Ambulatory Visit: Payer: Self-pay

## 2022-12-24 ENCOUNTER — Encounter: Payer: Self-pay | Admitting: Internal Medicine

## 2022-12-24 ENCOUNTER — Ambulatory Visit (AMBULATORY_SURGERY_CENTER): Payer: Commercial Managed Care - PPO | Admitting: Internal Medicine

## 2022-12-24 VITALS — BP 155/78 | HR 66 | Temp 98.2°F | Resp 20 | Ht 66.0 in | Wt 160.0 lb

## 2022-12-24 DIAGNOSIS — Z09 Encounter for follow-up examination after completed treatment for conditions other than malignant neoplasm: Secondary | ICD-10-CM

## 2022-12-24 DIAGNOSIS — Z1211 Encounter for screening for malignant neoplasm of colon: Secondary | ICD-10-CM | POA: Diagnosis not present

## 2022-12-24 DIAGNOSIS — K2970 Gastritis, unspecified, without bleeding: Secondary | ICD-10-CM | POA: Diagnosis not present

## 2022-12-24 DIAGNOSIS — D123 Benign neoplasm of transverse colon: Secondary | ICD-10-CM

## 2022-12-24 DIAGNOSIS — R053 Chronic cough: Secondary | ICD-10-CM

## 2022-12-24 DIAGNOSIS — Z86010 Personal history of colonic polyps: Secondary | ICD-10-CM

## 2022-12-24 DIAGNOSIS — D125 Benign neoplasm of sigmoid colon: Secondary | ICD-10-CM | POA: Diagnosis not present

## 2022-12-24 DIAGNOSIS — I10 Essential (primary) hypertension: Secondary | ICD-10-CM | POA: Diagnosis not present

## 2022-12-24 DIAGNOSIS — K319 Disease of stomach and duodenum, unspecified: Secondary | ICD-10-CM | POA: Diagnosis not present

## 2022-12-24 MED ORDER — SODIUM CHLORIDE 0.9 % IV SOLN
500.0000 mL | Freq: Once | INTRAVENOUS | Status: DC
Start: 2022-12-24 — End: 2022-12-24

## 2022-12-24 NOTE — Progress Notes (Signed)
Report to PACU, RN, vss, BBS= Clear.  

## 2022-12-24 NOTE — Patient Instructions (Signed)
YOU HAD AN ENDOSCOPIC PROCEDURE TODAY: Refer to the procedure report and other information in the discharge instructions given to you for any specific questions about what was found during the examination. If this information does not answer your questions, please call [place] office at [phone] to clarify.  ° °YOU SHOULD EXPECT: Some feelings of bloating in the abdomen. Passage of more gas than usual. Walking can help get rid of the air that was put into your GI tract during the procedure and reduce the bloating. If you had a lower endoscopy (such as a colonoscopy or flexible sigmoidoscopy) you may notice spotting of blood in your stool or on the toilet paper. Some abdominal soreness may be present for a day or two, also. ° °DIET: Your first meal following the procedure should be a light meal and then it is ok to progress to your normal diet. A half-sandwich or bowl of soup is an example of a good first meal. Heavy or fried foods are harder to digest and may make you feel nauseous or bloated. Drink plenty of fluids but you should avoid alcoholic beverages for 24 hours. If you had a esophageal dilation, please see attached instructions for diet.   ° °ACTIVITY: Your care partner should take you home directly after the procedure. You should plan to take it easy, moving slowly for the rest of the day. You can resume normal activity the day after the procedure however YOU SHOULD NOT DRIVE, use power tools, machinery or perform tasks that involve climbing or major physical exertion for 24 hours (because of the sedation medicines used during the test).  ° °SYMPTOMS TO REPORT IMMEDIATELY: °A gastroenterologist can be reached at any hour. Please call [phone]  for any of the following symptoms:  °Following lower endoscopy (colonoscopy, flexible sigmoidoscopy) °Excessive amounts of blood in the stool  °Significant tenderness, worsening of abdominal pains  °Swelling of the abdomen that is new, acute  °Fever of 100° or  higher  °Following upper endoscopy (EGD, EUS, ERCP, esophageal dilation) °Vomiting of blood or coffee ground material  °New, significant abdominal pain  °New, significant chest pain or pain under the shoulder blades  °Painful or persistently difficult swallowing  °New shortness of breath  °Black, tarry-looking or red, bloody stools ° °FOLLOW UP:  °If any biopsies were taken you will be contacted by phone or by letter within the next 1-3 weeks. Call [phone]  if you have not heard about the biopsies in 3 weeks.  °Please also call with any specific questions about appointments or follow up tests. ° °

## 2022-12-24 NOTE — Op Note (Signed)
[facility] Patient Name: [name] [name] Procedure Date: 12/24/2022 10:18 AM MRN: [id number] Endoscopist: Beverley Fiedler , MD, [id number] Age: 62 Referring MD:  Date of Birth: [birth year] Gender: Female Account #: [id number] Procedure:                Upper GI endoscopy Indications:              Chronic cough, exclusion of GERD Medicines:                Monitored Anesthesia Care Procedure:                Pre-Anesthesia Assessment:                           - Prior to the procedure, a History and Physical                            was performed, and patient medications and                            allergies were reviewed. The patient's tolerance of                            previous anesthesia was also reviewed. The risks                            and benefits of the procedure and the sedation                            options and risks were discussed with the patient.                            All questions were answered, and informed consent                            was obtained. Prior Anticoagulants: The patient has                            taken no anticoagulant or antiplatelet agents. ASA                            Grade Assessment: II - A patient with mild systemic                            disease. After reviewing the risks and benefits,                            the patient was deemed in satisfactory condition to                            undergo the procedure.                           After obtaining informed consent, the endoscope was  passed under direct vision. Throughout the                            procedure, the patient's blood pressure, pulse, and                            oxygen saturations were monitored continuously. The                            GIF [id number] #[id number] was introduced through the                            mouth, and advanced to the second part of duodenum.                            The upper GI endoscopy  was accomplished without                            difficulty. The patient tolerated the procedure                            well. Scope In: Scope Out: Findings:                 The examined esophagus was normal.                           Moderate inflammation characterized by congestion                            (edema), erosions, granularity and nodularity was                            found in the gastric antrum. Biopsies were taken                            with a cold forceps for histology and Helicobacter                            pylori testing.                           The examined duodenum was normal. Complications:            No immediate complications. Estimated Blood Loss:     Estimated blood loss was minimal. Impression:               - Normal esophagus.                           - Gastritis. Biopsied.                           - Normal examined duodenum. Recommendation:           - Patient has a contact number available for  emergencies. The signs and symptoms of potential                            delayed complications were discussed with the                            patient. Return to normal activities tomorrow.                            Written discharge instructions were provided to the                            patient.                           - Resume previous diet.                           - Continue present medications.                           - Await pathology results. Beverley Fiedler, MD 12/24/2022 10:52:06 AM This report has been signed electronically.

## 2022-12-24 NOTE — Progress Notes (Signed)
Called to room to assist during endoscopic procedure.  Patient ID and intended procedure confirmed with present staff. Received instructions for my participation in the procedure from the performing physician.  

## 2022-12-24 NOTE — Progress Notes (Signed)
GASTROENTEROLOGY PROCEDURE H&P NOTE   Primary Care Physician: Tracey Harries, MD    Reason for Procedure:  Cough and possible LPR; personal history of adenomatous colon polyps  Plan:    EGD and colonoscopy  Patient is appropriate for endoscopic procedure(s) in the ambulatory ([facility]) setting.  The nature of the procedure, as well as the risks, benefits, and alternatives were carefully and thoroughly reviewed with the patient. Ample time for discussion and questions allowed. The patient understood, was satisfied, and agreed to proceed.     HPI: [name] [name] is a 62 y.o. female who presents for EGD and colonoscopy.  Medical history as below.  Tolerated the prep.  No recent chest pain or shortness of breath.  No abdominal pain today.  Past Medical History:  Diagnosis Date   Allergic rhinitis, cause unspecified    Allergy    SEASONAL   Anxiety    Arthritis    KNEES,HANDS   Cancer    BASAL,SKIN   Extrinsic asthma, unspecified    Hyperlipidemia    Hypertension    IBS (irritable bowel syndrome)    Other and unspecified noninfectious gastroenteritis and colitis(558.9)    25 years ago    Past Surgical History:  Procedure Laterality Date   BREAST REDUCTION SURGERY     BUNIONECTOMY  2004   Bil   COLONOSCOPY     KNEE ARTHROSCOPY     right knee   rhinoplasty     TOTAL ABDOMINAL HYSTERECTOMY  2004   WISDOM TOOTH EXTRACTION      Prior to Admission medications   Medication Sig Start Date End Date Taking? Authorizing Provider  albuterol (VENTOLIN HFA) 108 (90 Base) MCG/ACT inhaler Inhale 2 puffs into the lungs every 6 hours as needed for wheezing or shortness of breath. 07/24/22  Yes Young, Joni Fears D, MD  Albuterol Sulfate 2.5 MG/0.5ML NEBU Inhale into the lungs as needed.   Yes [provider]  amLODipine (NORVASC) 2.5 MG tablet Take 1 tablet (2.5 mg total) by mouth daily. 11/05/22  Yes Meriam Sprague, MD  cetirizine (ZYRTEC) 10 MG tablet Take 10 mg by  mouth as needed.   Yes [provider]  estradiol (VIVELLE-DOT) 0.05 MG/24HR patch APPLY ONE PATCH TO SKIN TWICE A WEEK 07/02/22  Yes   fluticasone-salmeterol (ADVAIR DISKUS) 100-50 MCG/ACT AEPB INHALE ONE PUFF INTO THE LUNGS TWICE A DAY THEN RINSE MOUTH 04/28/22  Yes Young, Rennis Chris, MD  Misc Natural Products (PROGESTERONE) 1000 MG/60GM CREA Apply topically as needed.   Yes [provider]  Multiple Vitamin (MULTIVITAMIN) capsule Take 1 capsule by mouth daily.     Yes [provider]  Nebivolol HCl (BYSTOLIC) 20 MG TABS Take 1 tablet (20 mg total) by mouth daily. 11/05/22  Yes Meriam Sprague, MD  NON FORMULARY [facility] Scar Cream: Verapamil 10%; Pentoxifylline 5%  Apply 1 to 2 gm to affected areas 3-4x daily prn (100 gm)  - for Plantar Fibromas in arches  Faxed over to [facility] on 08/08/19 with refills as needed   Yes Marylene Land, Titorya, DPM  PARoxetine (PAXIL) 10 MG tablet Take one tablet (10 mg dose) by mouth daily. 05/30/22  Yes   spironolactone (ALDACTONE) 100 MG tablet Take 1 tablet (100 mg total) by mouth daily. 11/09/22  Yes Meriam Sprague, MD  Vitamin D, Ergocalciferol, (DRISDOL) 1.25 MG (50000 UNIT) CAPS capsule Take 1 capsule by mouth once a week. 08/31/22  Yes   EPINEPHrine 0.3 mg/0.3 mL IJ SOAJ  injection INJECT 0.3 MLS (0.3 MG DOSE) INTO THE MUSCLE ONCE AS NEEDED FOR ANAPHYLAXIS FOR UP TO ONE DOSE 04/15/22     ibuprofen (ADVIL) 200 MG tablet Take 200 mg by mouth as needed. Patient not taking: Reported on 12/24/2022    [provider]    Current Outpatient Medications  Medication Sig Dispense Refill   albuterol (VENTOLIN HFA) 108 (90 Base) MCG/ACT inhaler Inhale 2 puffs into the lungs every 6 hours as needed for wheezing or shortness of breath. 6.7 g 12   Albuterol Sulfate 2.5 MG/0.5ML NEBU Inhale into the lungs as needed.     amLODipine (NORVASC) 2.5 MG tablet Take 1 tablet (2.5 mg total) by mouth daily. 90 tablet 1    cetirizine (ZYRTEC) 10 MG tablet Take 10 mg by mouth as needed.     estradiol (VIVELLE-DOT) 0.05 MG/24HR patch APPLY ONE PATCH TO SKIN TWICE A WEEK 24 patch 4   fluticasone-salmeterol (ADVAIR DISKUS) 100-50 MCG/ACT AEPB INHALE ONE PUFF INTO THE LUNGS TWICE A DAY THEN RINSE MOUTH 60 each 6   Misc Natural Products (PROGESTERONE) 1000 MG/60GM CREA Apply topically as needed.     Multiple Vitamin (MULTIVITAMIN) capsule Take 1 capsule by mouth daily.       Nebivolol HCl (BYSTOLIC) 20 MG TABS Take 1 tablet (20 mg total) by mouth daily. 90 tablet 1   NON FORMULARY [facility] Scar Cream: Verapamil 10%; Pentoxifylline 5%  Apply 1 to 2 gm to affected areas 3-4x daily prn (100 gm)  - for Plantar Fibromas in arches  Faxed over to [facility] on 08/08/19 with refills as needed     PARoxetine (PAXIL) 10 MG tablet Take one tablet (10 mg dose) by mouth daily. 90 tablet 3   spironolactone (ALDACTONE) 100 MG tablet Take 1 tablet (100 mg total) by mouth daily. 90 tablet 2   Vitamin D, Ergocalciferol, (DRISDOL) 1.25 MG (50000 UNIT) CAPS capsule Take 1 capsule by mouth once a week. 12 capsule 1   EPINEPHrine 0.3 mg/0.3 mL IJ SOAJ injection INJECT 0.3 MLS (0.3 MG DOSE) INTO THE MUSCLE ONCE AS NEEDED FOR ANAPHYLAXIS FOR UP TO ONE DOSE 2 each 0   ibuprofen (ADVIL) 200 MG tablet Take 200 mg by mouth as needed. (Patient not taking: Reported on 12/24/2022)     Current Facility-Administered Medications  Medication Dose Route Frequency Provider Last Rate Last Admin   0.9 %  sodium chloride infusion  500 mL Intravenous Continuous Adalin Vanderploeg, Carie CaddyJay M, MD       0.9 %  sodium chloride infusion  500 mL Intravenous Once Marcoantonio Legault, Carie CaddyJay M, MD       diclofenac sodium (VOLTAREN) 1 % transdermal gel 2 g  2 g Topical QID Kirtland Bouchardlark, Gilbert W, PA-C        Allergies as of 12/24/2022 - Review Complete 12/24/2022  Allergen Reaction Noted   Bee venom Anaphylaxis 08/25/2013   Amlodipine  08/25/2013   Benicar [olmesartan]   08/01/2013   Codeine     Flagyl [metronidazole]  08/01/2013   Hctz [hydrochlorothiazide]  08/20/2016   Irbesartan Itching 11/26/2022   Lisinopril Cough 10/05/2022    Family History  Problem Relation Age of Onset   ALS Mother    Coronary artery disease Father    Hypertension Father    Hypertension Sister    Colon cancer Maternal Uncle 60   Hypertension Other        sibling   Bipolar disorder Other        sibling  Colon polyps Neg Hx    Crohn's disease Neg Hx    Esophageal cancer Neg Hx    Rectal cancer Neg Hx    Stomach cancer Neg Hx    Ulcerative colitis Neg Hx     Social History   Socioeconomic History   Marital status: Significant Other    Spouse name: Not on file   Number of children: Not on file   Years of education: Not on file   Highest education level: Not on file  Occupational History   Not on file  Tobacco Use   Smoking status: Former    Packs/day: 0.20    Years: 10.00    Additional pack years: 0.00    Total pack years: 2.00    Types: Cigarettes    Quit date: 09/15/1991    Years since quitting: 31.2   Smokeless tobacco: Never   Tobacco comments:    Former smoker 04/17/22  Vaping Use   Vaping Use: Never used  Substance and Sexual Activity   Alcohol use: Yes    Alcohol/week: 4.0 - 6.0 standard drinks of alcohol    Types: 4 - 6 Cans of beer per week    Comment: 2-3 beers 2-3 days a week 04/17/22   Drug use: No   Sexual activity: Never    Partners: Male    Birth control/protection: None  Other Topics Concern   Not on file  Social History Narrative   Not on file   Social Determinants of Health   Financial Resource Strain: Not on file  Food Insecurity: Not on file  Transportation Needs: Not on file  Physical Activity: Not on file  Stress: Not on file  Social Connections: Not on file  Intimate Partner Violence: Not on file    Physical Exam: Vital signs in last 24 hours: @BP  131/68   Pulse (!) 55   Temp 98.2 F (36.8 C)   Ht 5\' 6"   (1.676 m)   Wt 160 lb (72.6 kg)   SpO2 99%   BMI 25.82 kg/m  GEN: NAD EYE: Sclerae anicteric ENT: MMM CV: Non-tachycardic Pulm: CTA b/l GI: Soft, NT/ND NEURO:  Alert & Oriented x 3   Erick Blinks, MD [facility]  12/24/2022 10:17 AM

## 2022-12-24 NOTE — Op Note (Signed)
[facility] Patient Name: [name] [name] Procedure Date: 12/24/2022 10:17 AM MRN: [id number] Endoscopist: Beverley Fiedler , MD, [id number] Age: 62 Referring MD:  Date of Birth: 13-Mar-1961 Gender: Female Account #: [id number] Procedure:                Colonoscopy Indications:              High risk colon cancer surveillance: Personal                            history of non-advanced adenomas, Last colonoscopy:                            December 2017 (2 adenomas), Dec 2014 (3 adenomas) Medicines:                Monitored Anesthesia Care Procedure:                Pre-Anesthesia Assessment:                           - Prior to the procedure, a History and Physical                            was performed, and patient medications and                            allergies were reviewed. The patient's tolerance of                            previous anesthesia was also reviewed. The risks                            and benefits of the procedure and the sedation                            options and risks were discussed with the patient.                            All questions were answered, and informed consent                            was obtained. Prior Anticoagulants: The patient has                            taken no anticoagulant or antiplatelet agents. ASA                            Grade Assessment: II - A patient with mild systemic                            disease. After reviewing the risks and benefits,                            the patient was deemed in satisfactory condition to  undergo the procedure.                           After obtaining informed consent, the colonoscope                            was passed under direct vision. Throughout the                            procedure, the patient's blood pressure, pulse, and                            oxygen saturations were monitored continuously. The                            Olympus  [id number]-H190DL ([id number](SN#[id number]) Colonoscope was                            introduced through the anus and advanced to the                            cecum, identified by appendiceal orifice and                            ileocecal valve. The colonoscopy was performed                            without difficulty. The patient tolerated the                            procedure well. The quality of the bowel                            preparation was good. The ileocecal valve,                            appendiceal orifice, and rectum were photographed. Scope In: 10:32:19 AM Scope Out: 10:47:30 AM Scope Withdrawal Time: 0 hours 10 minutes 48 seconds  Total Procedure Duration: 0 hours 15 minutes 11 seconds  Findings:                 The digital rectal exam was normal.                           A 6 mm polyp was found in the transverse colon. The                            polyp was sessile. The polyp was removed with a                            cold snare. Resection and retrieval were complete.                           A 3 mm polyp was found in the sigmoid colon. The  polyp was sessile. The polyp was removed with a                            cold snare. Resection and retrieval were complete.                           A few small-mouthed diverticula were found in the                            descending colon.                           Internal hemorrhoids were found during                            retroflexion. The hemorrhoids were small. Complications:            No immediate complications. Estimated Blood Loss:     Estimated blood loss: none. Impression:               - One 6 mm polyp in the transverse colon, removed                            with a cold snare. Resected and retrieved.                           - One 3 mm polyp in the sigmoid colon, removed with                            a cold snare. Resected and retrieved.                           - Mild  diverticulosis in the descending colon.                           - Small internal hemorrhoids. Recommendation:           - Patient has a contact number available for                            emergencies. The signs and symptoms of potential                            delayed complications were discussed with the                            patient. Return to normal activities tomorrow.                            Written discharge instructions were provided to the                            patient.                           - Resume previous diet.                           -  Continue present medications.                           - Await pathology results.                           - Repeat colonoscopy is recommended for                            surveillance. The colonoscopy date will be                            determined after pathology results from today's                            exam become available for review. Beverley Fiedler, MD 12/24/2022 10:54:08 AM This report has been signed electronically.

## 2022-12-25 ENCOUNTER — Telehealth: Payer: Self-pay

## 2022-12-25 NOTE — Telephone Encounter (Signed)
  Follow up Call-     12/24/2022    9:44 AM  Call back number  Post procedure Call Back phone  # [phone]  Permission to leave phone message Yes     Patient questions:  Do you have a fever, pain , or abdominal swelling? No. Pain Score  0 *  Have you tolerated food without any problems? No.  Have you been able to return to your normal activities? No.  Do you have any questions about your discharge instructions: Diet   No. Medications  No. Follow up visit  No.  Do you have questions or concerns about your Care? No.  Actions: * If pain score is 4 or above: No action needed, pain <4.

## 2022-12-29 ENCOUNTER — Encounter: Payer: Self-pay | Admitting: Internal Medicine

## 2023-01-13 ENCOUNTER — Other Ambulatory Visit (HOSPITAL_COMMUNITY): Payer: Self-pay

## 2023-01-13 ENCOUNTER — Other Ambulatory Visit: Payer: Self-pay

## 2023-01-15 ENCOUNTER — Other Ambulatory Visit (HOSPITAL_COMMUNITY): Payer: Self-pay

## 2023-01-25 ENCOUNTER — Telehealth: Payer: Self-pay | Admitting: *Deleted

## 2023-01-25 ENCOUNTER — Encounter: Payer: Self-pay | Admitting: Orthopaedic Surgery

## 2023-01-25 MED ORDER — SPIRONOLACTONE 50 MG PO TABS: 50.0000 mg | ORAL_TABLET | Freq: Every day | ORAL | 1 refills | Status: DC

## 2023-01-25 NOTE — Telephone Encounter (Signed)
Pt reported that she decreased her spironolactone 50 mg po daily for complaints leg cramps.  She was previously on 100 mg po daily.    Pt states her leg cramps are gone on the 50 mg dose and her pressures are still good in the 120s over 80s.   Pt wanted to make Dr. Shari Prows aware of this.   Changed the dose of her spironolactone to 50 mg po daily and will make Dr. Shari Prows aware of this change.

## 2023-01-27 ENCOUNTER — Telehealth: Payer: Self-pay

## 2023-01-27 NOTE — Telephone Encounter (Addendum)
VOB submitted for Orthoivsc, right knee. Submitted for euflexxa due to patient not having a good outcome with Orthovisc.

## 2023-02-01 ENCOUNTER — Telehealth: Payer: Self-pay

## 2023-02-01 NOTE — Telephone Encounter (Signed)
PA required for Euflexxa, right knee. Submitted a PA online through euflexxa portal.  Submitted additional information also.

## 2023-02-02 ENCOUNTER — Other Ambulatory Visit (HOSPITAL_COMMUNITY): Payer: Self-pay

## 2023-02-12 ENCOUNTER — Telehealth: Payer: Self-pay

## 2023-02-12 NOTE — Telephone Encounter (Signed)
If [facility] sees her would that work since Dr. Magnus Ivan is booked up

## 2023-02-12 NOTE — Telephone Encounter (Signed)
Please advise 

## 2023-02-12 NOTE — Telephone Encounter (Signed)
Talked with patient and advised her of Denial letter from [facility] for Euflexxa, right knee.    Advised patient that additional information was sent to [facility] pertaining to there preferred products, that patient is not able to receive.  Patient would like to know what the next option will be?  Please let me know if Dr. Magnus Ivan would like to do a peer to peer review.

## 2023-02-12 NOTE — Telephone Encounter (Signed)
Hard to say. I would let Dr. Magnus Ivan decide.

## 2023-02-12 NOTE — Telephone Encounter (Signed)
Called and left a VM for patient to return my call concerning a denial for euflexxa, right knee.

## 2023-02-15 NOTE — Telephone Encounter (Signed)
FYI can you please set this up

## 2023-02-15 NOTE — Telephone Encounter (Signed)
Talked with patient and appointment has been scheduled for F/U of right knee.

## 2023-02-24 ENCOUNTER — Other Ambulatory Visit (HOSPITAL_COMMUNITY): Payer: Self-pay

## 2023-02-24 ENCOUNTER — Ambulatory Visit: Payer: Commercial Managed Care - PPO | Attending: Cardiology | Admitting: Cardiology

## 2023-02-24 ENCOUNTER — Encounter: Payer: Self-pay | Admitting: Cardiology

## 2023-02-24 VITALS — BP 122/76 | HR 95 | Ht 66.0 in | Wt 165.0 lb

## 2023-02-24 DIAGNOSIS — I77819 Aortic ectasia, unspecified site: Secondary | ICD-10-CM

## 2023-02-24 DIAGNOSIS — I10 Essential (primary) hypertension: Secondary | ICD-10-CM | POA: Diagnosis not present

## 2023-02-24 DIAGNOSIS — I480 Paroxysmal atrial fibrillation: Secondary | ICD-10-CM

## 2023-02-24 DIAGNOSIS — D6869 Other thrombophilia: Secondary | ICD-10-CM | POA: Diagnosis not present

## 2023-02-24 MED ORDER — RIVAROXABAN 20 MG PO TABS: 20.0000 mg | ORAL_TABLET | Freq: Every day | ORAL | 0 refills | Status: DC

## 2023-02-24 MED ORDER — RIVAROXABAN 20 MG PO TABS
20.0000 mg | ORAL_TABLET | Freq: Every day | ORAL | 6 refills | Status: DC
Start: 2023-02-24 — End: 2023-07-19
  Filled 2023-02-24 – 2023-03-17 (×2): qty 30, 30d supply, fill #0
  Filled 2023-04-05 – 2023-04-08 (×4): qty 30, 30d supply, fill #1
  Filled 2023-05-13: qty 30, 30d supply, fill #2
  Filled 2023-06-10: qty 30, 30d supply, fill #3
  Filled 2023-07-11: qty 30, 30d supply, fill #4
  Filled ????-??-??: fill #2

## 2023-02-24 MED ORDER — DILTIAZEM HCL 30 MG PO TABS
30.0000 mg | ORAL_TABLET | Freq: Two times a day (BID) | ORAL | 4 refills | Status: DC | PRN
  Filled 2023-02-24 (×2): qty 60, 30d supply, fill #0
  Filled 2023-07-06: qty 60, 30d supply, fill #1

## 2023-02-24 NOTE — Progress Notes (Signed)
Cardiology Office Note:    Date:  02/24/2023   ID:  [name] [name], DOB 05/23/1961, MRN [id number]  PCP:  Tracey Harries, MD   [facility] Providers Cardiologist:  None {  Referring MD: Tracey Harries, MD    History of Present Illness:    [name] [name] is a 62 y.o. female with a hx of HTN and paroxysmal Afib who presents to clinic for follow-up.   The patient was initially diagnosed with atrial fibrillation in 2020 after presenting to her PCP with symptoms of palpitations. She spontaneously converted while in office. She had a stress echo with Dr Boneta Lucks which was normal. She had not had any interim afib until she woke 04/16/22 in afib, confirmed by her smart watch. Patient does admit she had more alcohol than usual the night prior at a concert. She took extra doses of BB with minimal improvement. ECG showed afib HR 106. Fortunately, she spontaneously converted later that day.  Was seen in Afib clinic on 05/29/22 where she was doing well. TTE 06/02/22 with LVEF 60-65%, no WMA, normal diastolic function, normal RV, mild LAE, trivial MR, mild dilation of the ascending aorta.  Contacted clinic on 02/23/23 as she developed recurrent episode of Afib prompting visit today. She took extra dose of bystolic at that time.  Today, the patient state she is doing okay. Continues to feel "off" with her Afib with increased fatigue. No chest pain, SOB, orthopnea or PND. Has mild palpitations but they are not overly bothersome. Prefers to start with xarelto as she is afraid she may forget the BID dosing with apixaban. Discussed options going forward and she would like to do 3 weeks of AC and then plan for DCCV if remains in Afib.   Past Medical History:  Diagnosis Date   Allergic rhinitis, cause unspecified    Allergy    SEASONAL   Anxiety    Arthritis    KNEES,HANDS   Cancer (HCC)    BASAL,SKIN   Extrinsic asthma, unspecified    Hyperlipidemia    Hypertension    IBS (irritable  bowel syndrome)    Other and unspecified noninfectious gastroenteritis and colitis(558.9)    25 years ago    Past Surgical History:  Procedure Laterality Date   BREAST REDUCTION SURGERY     BUNIONECTOMY  2004   Bil   COLONOSCOPY     KNEE ARTHROSCOPY     right knee   rhinoplasty     TOTAL ABDOMINAL HYSTERECTOMY  2004   WISDOM TOOTH EXTRACTION      Current Medications: Current Meds  Medication Sig   albuterol (VENTOLIN HFA) 108 (90 Base) MCG/ACT inhaler Inhale 2 puffs into the lungs every 6 hours as needed for wheezing or shortness of breath.   Albuterol Sulfate 2.5 MG/0.5ML NEBU Inhale into the lungs as needed.   amLODipine (NORVASC) 2.5 MG tablet Take 1 tablet (2.5 mg total) by mouth daily.   cetirizine (ZYRTEC) 10 MG tablet Take 10 mg by mouth as needed.   diltiazem (CARDIZEM) 30 MG tablet Take 1 tablet (30 mg total) by mouth 2 (two) times daily as needed (for breakthrough palpitations/afib).   EPINEPHrine 0.3 mg/0.3 mL IJ SOAJ injection INJECT 0.3 MLS (0.3 MG DOSE) INTO THE MUSCLE ONCE AS NEEDED FOR ANAPHYLAXIS FOR UP TO ONE DOSE   estradiol (VIVELLE-DOT) 0.05 MG/24HR patch APPLY ONE PATCH TO SKIN TWICE A WEEK   fluticasone-salmeterol (ADVAIR DISKUS) 100-50 MCG/ACT AEPB INHALE ONE PUFF INTO THE LUNGS TWICE A  DAY THEN RINSE MOUTH   Misc Natural Products (PROGESTERONE) 1000 MG/60GM CREA Apply topically as needed.   Multiple Vitamin (MULTIVITAMIN) capsule Take 1 capsule by mouth daily.     Nebivolol HCl (BYSTOLIC) 20 MG TABS Take 1 tablet (20 mg total) by mouth daily.   NON FORMULARY [facility] Scar Cream: Verapamil 10%; Pentoxifylline 5%  Apply 1 to 2 gm to affected areas 3-4x daily prn (100 gm)  - for Plantar Fibromas in arches  Faxed over to [facility] on 08/08/19 with refills as needed   PARoxetine (PAXIL) 10 MG tablet Take one tablet (10 mg dose) by mouth daily.   rivaroxaban (XARELTO) 20 MG TABS tablet Take 1 tablet (20 mg total) by mouth daily with  supper.   rivaroxaban (XARELTO) 20 MG TABS tablet Take 1 tablet (20 mg total) by mouth daily with supper.   spironolactone (ALDACTONE) 50 MG tablet Take 1 tablet (50 mg total) by mouth daily.   Vitamin D, Ergocalciferol, (DRISDOL) 1.25 MG (50000 UNIT) CAPS capsule Take 1 capsule by mouth once a week.   [DISCONTINUED] ibuprofen (ADVIL) 200 MG tablet Take 200 mg by mouth as needed.   Current Facility-Administered Medications for the 02/24/23 encounter (Office Visit) with Meriam Sprague, MD  Medication   diclofenac sodium (VOLTAREN) 1 % transdermal gel 2 g     Allergies:   Bee venom, Amlodipine, Benicar [olmesartan], Codeine, Flagyl [metronidazole], Hctz [hydrochlorothiazide], Irbesartan, and Lisinopril   Social History   Socioeconomic History   Marital status: Significant Other    Spouse name: Not on file   Number of children: Not on file   Years of education: Not on file   Highest education level: Not on file  Occupational History   Not on file  Tobacco Use   Smoking status: Former    Packs/day: 0.20    Years: 10.00    Additional pack years: 0.00    Total pack years: 2.00    Types: Cigarettes    Quit date: 09/15/1991    Years since quitting: 31.4   Smokeless tobacco: Never   Tobacco comments:    Former smoker 04/17/22  Vaping Use   Vaping Use: Never used  Substance and Sexual Activity   Alcohol use: Yes    Alcohol/week: 4.0 - 6.0 standard drinks of alcohol    Types: 4 - 6 Cans of beer per week    Comment: 2-3 beers 2-3 days a week 04/17/22   Drug use: No   Sexual activity: Never    Partners: Male    Birth control/protection: None  Other Topics Concern   Not on file  Social History Narrative   Not on file   Social Determinants of Health   Financial Resource Strain: Not on file  Food Insecurity: Not on file  Transportation Needs: Not on file  Physical Activity: Not on file  Stress: Not on file  Social Connections: Not on file     Family History: The  patient's family history includes ALS in her mother; Bipolar disorder in an other family member; Colon cancer (age of onset: 43) in her maternal uncle; Coronary artery disease in her father; Hypertension in her father, sister, and another family member. There is no history of Colon polyps, Crohn's disease, Esophageal cancer, Rectal cancer, Stomach cancer, or Ulcerative colitis.  ROS:   As per HPI    EKGs/Labs/Other Studies Reviewed:    The following studies were reviewed today:  TTE 06/02/22: IMPRESSIONS   1. Left ventricular ejection  fraction, by estimation, is 60 to 65%. Left  ventricular ejection fraction by 3D volume is 64 %. The left ventricle has  normal function. The left ventricle has no regional wall motion  abnormalities. Left ventricular diastolic   parameters were normal.   2. Right ventricular systolic function is normal. The right ventricular  size is normal. There is normal pulmonary artery systolic pressure. The  estimated right ventricular systolic pressure is 31.7 mmHg.   3. Left atrial size was mildly dilated.   4. The mitral valve is normal in structure. Trivial mitral valve  regurgitation. No evidence of mitral stenosis.   5. The aortic valve is grossly normal. Aortic valve regurgitation is  trivial. No aortic stenosis is present.   6. Aortic dilatation noted. There is mild dilatation of the ascending  aorta, measuring 40 mm.   7. The inferior vena cava is normal in size with greater than 50%  respiratory variability, suggesting right atrial pressure of 3 mmHg.   EKG:  ECG with Afib with HR 105-personally reviwed   Recent Labs: 11/03/2022: BUN 14; Creatinine, Ser 0.79; Potassium 4.9; Sodium 137   Recent Lipid Panel No results found for: "CHOL", "TRIG", "HDL", "CHOLHDL", "VLDL", "LDLCALC", "LDLDIRECT"   Risk Assessment/Calculations:    CHA2DS2-VASc Score = 2   This indicates a 2.2% annual risk of stroke. The patient's score is based upon: CHF History:  0 HTN History: 1 Diabetes History: 0 Stroke History: 0 Vascular Disease History: 0 Age Score: 0 Gender Score: 1                Physical Exam:    VS:  BP 122/76   Pulse 95   Ht 5\' 6"  (1.676 m)   Wt 165 lb (74.8 kg)   SpO2 99%   BMI 26.63 kg/m     Wt Readings from Last 3 Encounters:  02/24/23 165 lb (74.8 kg)  12/24/22 160 lb (72.6 kg)  11/26/22 160 lb (72.6 kg)     GEN: Well nourished, well developed in no acute distress HEENT: Normal NECK: No JVD; No carotid bruits CARDIAC: Irregularly, irregular. No murmurs RESPIRATORY:  Clear to auscultation without rales, wheezing or rhonchi  ABDOMEN: Soft, non-tender, non-distended MUSCULOSKELETAL:  No edema; No deformity  SKIN: Warm and dry NEUROLOGIC:  Alert and oriented x 3 PSYCHIATRIC:  Normal affect   ASSESSMENT:    1. Paroxysmal atrial fibrillation (HCC)   2. Essential hypertension   3. Aortic dilatation (HCC)   4. Secondary hypercoagulable state (HCC)    PLAN:    In order of problems listed above:  #Paroxysmal Afib: CHADs-vasc 2. TTE with LVEF 60-65%, no significant valve disease. Has had intermittent episodes since 2020 and is back in Afib with HR 100s. Discussed options going forward and patient prefers starting xarelto and plan for DCCV if remains out of rhythm after 3 weeks.  -Continue nebivolol 20mg  daily with extra dose as needed -Start dilt 30mg  BID prn for palpitations -Start xarelto for [facility] -If remains in Afib after 3 weeks AC, can plan for DCCV -Could consider front line ablation in future if needed  #HTN: -Very well controlled today -Continue amlodipine 2.5mg  daily -Continue nebivolol 20mg  daily -Continue spironolactone 25mg  daily  #Ascending Aorta Dilation: TTE with ascending aorta 4.0cm. -Will continues serial monitoring with yearly echoes -BP well controlled.      Follow-up:  6 month follow-up with Dr. Cristal Deer  Medication Adjustments/Labs and Tests Ordered: Current medicines are  reviewed at length with the patient today.  Concerns regarding medicines are outlined above.   Orders Placed This Encounter  Procedures   EKG 12-Lead   Meds ordered this encounter  Medications   rivaroxaban (XARELTO) 20 MG TABS tablet    Sig: Take 1 tablet (20 mg total) by mouth daily with supper.    Dispense:  30 tablet    Refill:  6   rivaroxaban (XARELTO) 20 MG TABS tablet    Sig: Take 1 tablet (20 mg total) by mouth daily with supper.    Dispense:  28 tablet    Refill:  0    Order Specific Question:   Lot Number?    Answer:   [id number]    Order Specific Question:   Expiration Date?    Answer:   06/14/2024   diltiazem (CARDIZEM) 30 MG tablet    Sig: Take 1 tablet (30 mg total) by mouth 2 (two) times daily as needed (for breakthrough palpitations/afib).    Dispense:  60 tablet    Refill:  4   Patient Instructions  Medication Instructions:   START TAKING XARELTO 20 MG BY MOUTH DAILY WITH SUPPER  START TAKING DILTIAZEM 30 MG BY MOUTH TWICE DAILY AS NEEDED FOR BREAKTHROUGH PALPITATIONS/AFIB  *If you need a refill on your cardiac medications before your next appointment, please call your pharmacy*    Follow-Up: At [facility], you and your health needs are our priority.  As part of our continuing mission to provide you with exceptional heart care, we have created designated Provider Care Teams.  These Care Teams include your primary Cardiologist (physician) and Advanced Practice Providers (APPs -  Physician Assistants and Nurse Practitioners) who all work together to provide you with the care you need, when you need it.  We recommend signing up for the patient portal called "MyChart".  Sign up information is provided on this After Visit Summary.  MyChart is used to connect with patients for Virtual Visits (Telemedicine).  Patients are able to view lab/test results, encounter notes, upcoming appointments, etc.  Non-urgent messages can be sent to your provider as well.   To  learn more about what you can do with MyChart, go to [email or web address].    Your next appointment:   6 month(s)  Provider:   Jodelle Red, MD        Signed, Meriam Sprague, MD  02/24/2023 5:17 PM    [facility]

## 2023-02-24 NOTE — Patient Instructions (Signed)
Medication Instructions:   START TAKING XARELTO 20 MG BY MOUTH DAILY WITH SUPPER  START TAKING DILTIAZEM 30 MG BY MOUTH TWICE DAILY AS NEEDED FOR BREAKTHROUGH PALPITATIONS/AFIB  *If you need a refill on your cardiac medications before your next appointment, please call your pharmacy*    Follow-Up: At [facility], you and your health needs are our priority.  As part of our continuing mission to provide you with exceptional heart care, we have created designated Provider Care Teams.  These Care Teams include your primary Cardiologist (physician) and Advanced Practice Providers (APPs -  Physician Assistants and Nurse Practitioners) who all work together to provide you with the care you need, when you need it.  We recommend signing up for the patient portal called "MyChart".  Sign up information is provided on this After Visit Summary.  MyChart is used to connect with patients for Virtual Visits (Telemedicine).  Patients are able to view lab/test results, encounter notes, upcoming appointments, etc.  Non-urgent messages can be sent to your provider as well.   To learn more about what you can do with MyChart, go to [email or web address].    Your next appointment:   6 month(s)  Provider:   Jodelle Red, MD

## 2023-03-01 ENCOUNTER — Telehealth: Payer: Self-pay | Admitting: *Deleted

## 2023-03-01 ENCOUNTER — Encounter: Payer: Self-pay | Admitting: Orthopaedic Surgery

## 2023-03-01 NOTE — Telephone Encounter (Signed)
Pt spoke with Dr. Shari [name] this morning about her afib update. She is still experiencing afib but rate is controlled.    She is taking her xarelto and will be anti-coagulated fully by the week of July 4th.  Pt states she will be having her orthopedic MD sending over a clearance for her to get knee injections.  Dr. Shari [name] advised for her to hold off on this until we can get her back in rhythm and she's been anti-coagulated for sometime.  She does not want to interrupt pts xarelto.   Per Dr. Shari [name], she wants this pt to see an APP on the week of the 4th to check rhythm status and if still in afib at that time, schedule a DCCV at that visit.   Pt states she prefers seeing [name] Bridegroom NP or [name] Searing NP.   Scheduled the pt to see [name] Bridegroom NP for 7/2 at 2:45 pm.  Pt verbalized understanding and agrees with this plan.

## 2023-03-10 ENCOUNTER — Encounter: Payer: Self-pay | Admitting: Physician Assistant

## 2023-03-10 ENCOUNTER — Telehealth: Payer: Self-pay

## 2023-03-10 ENCOUNTER — Ambulatory Visit: Payer: Commercial Managed Care - PPO | Admitting: Physician Assistant

## 2023-03-10 ENCOUNTER — Telehealth: Payer: Self-pay | Admitting: Physician Assistant

## 2023-03-10 DIAGNOSIS — M1711 Unilateral primary osteoarthritis, right knee: Secondary | ICD-10-CM

## 2023-03-10 MED ORDER — HYALURONAN 88 MG/4ML IX SOSY
88.0000 mg | PREFILLED_SYRINGE | INTRA_ARTICULAR | Status: AC | PRN
Start: 2023-03-10 — End: 2023-03-10
  Administered 2023-03-10: 88 mg via INTRA_ARTICULAR

## 2023-03-10 MED ORDER — SODIUM HYALURONATE (VISCOSUP) 20 MG/2ML IX SOSY
20.0000 mg | PREFILLED_SYRINGE | INTRA_ARTICULAR | Status: AC | PRN
Start: 2023-03-10 — End: 2023-03-10
  Administered 2023-03-10: 20 mg via INTRA_ARTICULAR

## 2023-03-10 NOTE — Telephone Encounter (Signed)
Patient was scheduled to see Bronson Curb today. She historically had gotten Euflexxa series without any difficulty until last round of gel insurance required her to get orthovisc. She had a reaction to the medication and it took her a long time to recover. Last time she was here we asked April to get auth for Euflexxa and it was denied. However when patient came in today she showed [place] a letter she received from her insurance that stated Euflexxa was approved. I advised Bronson Curb to proceed with Euflexxa since she had this documentation (she also is a Producer, television/film/video) I had her take a screenshot of the documentation and send it to [place] via [employer].

## 2023-03-10 NOTE — Telephone Encounter (Signed)
Pt is approved for Effulexa injection and wants to get her injections with Rexene Edison please advise we could not find times and dates that worked up front today and unsure if they are covering 100%

## 2023-03-10 NOTE — Progress Notes (Signed)
   Procedure Note  Patient: [name] [name]             Date of Birth: [birth year]           MRN: [id number]             Visit Date: 03/10/2023 HPI: Dafina comes in today for Euflexxa injection right knee.  She presents with her a approval letter from her insurance company.  States that her knee just recently started bothering her.  She has had good response to Euflexxa in the past.  She has known osteoarthritis of her knee.  She has failed conservative treatment which is included exercises, injections including cortisone injections.  She has had change in medical status has had to be placed on Xarelto secondary to A-fib.  Physical exam: General: Well-developed well-nourished female no acute distress non antalgic gait. Right knee: Good range of motion.  No abnormal warmth erythema or effusion.   Procedures: Visit Diagnoses: No diagnosis found.  Large Joint Inj: R knee on 03/10/2023 1:06 PM Indications: pain Details: 22 G 1.5 in needle, anterolateral approach  Arthrogram: No  Medications: 88 mg Hyaluronan 88 MG/4ML; 20 mg Sodium Hyaluronate (Viscosup) 20 MG/2ML Outcome: tolerated well, no immediate complications Procedure, treatment alternatives, risks and benefits explained, specific risks discussed. Consent was given by the patient. Immediately prior to procedure a time out was called to verify the correct patient, procedure, equipment, support staff and site/side marked as required. Patient was prepped and draped in the usual sterile fashion.     Plan: Will see her back in 1 week for her second Euflexxa injection.  She tolerated the injection well.

## 2023-03-10 NOTE — Telephone Encounter (Signed)
Specialist office visits (if billed) is covered at 100% of the allowable after a $50 copay. Drug is covered at 100% of allowable amount and Administration (20610) is covered at 100% of allowable amount

## 2023-03-10 NOTE — Telephone Encounter (Signed)
This is what the pt. Received. The letter in the chart said denied

## 2023-03-10 NOTE — Telephone Encounter (Signed)
Called and scheduled

## 2023-03-15 NOTE — H&P (View-Only) (Signed)
Cardiology Office Note:  .   Date:  03/16/2023  ID:  [name] [name], DOB [birth year], MRN [id number] PCP: Tracey Harries, MD  [facility] Providers Cardiologist:  None    Patient Profile: .      PMH PAF Onset 2020 after presenting with palpitations Chronic anticoagulation with Xarelto CHA2DS2-VASc score of 2 Considered to be a good candidate for frontline ablation per A Fib Clinic Hypertension Ascending aorta dilatation Ascending aortic dilatation measuring 40 mm on echo 06/05/2022  Last cardiology clinic visit was 02/24/23 with Dr. Shari Prows for recurrent A-fib.  She took extra dose of Bystolic. Was feeling more fatigued and "off."  Has mild palpitations but they are not burdensome. Agreeable to start Xarelto for stroke prevention and consider DCCV after 3 weeks of AC if she remains in a fib.        History of Present Illness: [name]   [name] [name] is a very pleasant 62 y.o. female who is here today for follow-up of persistent atrial fibrillation. She has been taking nebivolol 20 mg BID along with diltiazem 30 mg usually 2 x per day to keep HR < 100 bpm. Gets lightheaded and short of breath with higher rates, is more aware of shortness of breath. Resting HR is usually 60 or below. No chest pain, orthopnea, PND, presyncope, or syncope.  She works as a Engineer, civil (consulting) in our cardiology clinic and occasionally notes ankle edema after a full day of work. It resolves overnight. BP previously well controlled on lisinopril but it caused cough. After stopping lisinopril, it has taken some time to get her BP well controlled. Currently on amlodipine, nebivolol and spironolactone with well controlled BP.  No recent lipid panel to review. She reports PCP has been monitoring. Her mother had ALS and a provider previously told her not to take statins. This is 3rd episode of a fib.  She previously converted and 2 days or less.   ROS: + shortness of breath, lightheadedness with a fib       Studies  Reviewed: [name]   EKG Interpretation Date/Time:  Tuesday March 16 2023 14:47:14 EDT Ventricular Rate:  101 PR Interval:    QRS Duration:  64 QT Interval:  354 QTC Calculation: 459 R Axis:   87  Text Interpretation: Atrial fibrillation with rapid ventricular response Low voltage QRS Cannot rule out Anterior infarct (cited on or before 17-Apr-2022) When compared with ECG of 29-May-2022 08:36, Atrial fibrillation has replaced Sinus rhythm Vent. rate has increased BY  44 BPM Nonspecific T wave abnormality now evident in Inferior leads Nonspecific T wave abnormality now evident in Lateral leads Confirmed by Eligha Bridegroom [phone]) on 03/16/2023 2:55:13 PM     Risk Assessment/Calculations:    CHA2DS2-VASc Score = 2   This indicates a 2.2% annual risk of stroke. The patient's score is based upon: CHF History: 0 HTN History: 1 Diabetes History: 0 Stroke History: 0 Vascular Disease History: 0 Age Score: 0 Gender Score: 1             Physical Exam:   VS:  BP 110/70   Pulse (!) 102   Ht 5\' 6"  (1.676 m)   Wt 163 lb (73.9 kg)   SpO2 98%   BMI 26.31 kg/m    Wt Readings from Last 3 Encounters:  03/16/23 163 lb (73.9 kg)  02/24/23 165 lb (74.8 kg)  12/24/22 160 lb (72.6 kg)    GEN: Well nourished, well developed in no acute distress NECK: No JVD;  No carotid bruits CARDIAC: RRR, no murmurs, rubs, gallops RESPIRATORY:  Clear to auscultation without rales, wheezing or rhonchi  ABDOMEN: Soft, non-tender, non-distended EXTREMITIES:  No edema; No deformity     ASSESSMENT AND PLAN: .    Persistent atrial fibrillation: She remains in atrial fibrillation with ventricular rate of 101 bpm.  Has been taking additional dose of Bystolic along with low-dose diltiazem to keep HR < 100 bpm. No history of prior cardioversion. No missed doses of Xarelto.  Risks of cardioversion reviewed with patient and she would like to proceed.  We discussed that she typically takes Xarelto with a meal consisting of >  500 calories, which is necessary for absorption. Discussed with Pharm D and we encouraged her to take Xarelto on morning of DCCV despite being NPO.  She will follow-up in A-fib clinic or with APP following cardioversion.   Mixed hyperlipidemia: LDL 153, triglycerides 191 on 04/15/22.  We briefly discussed goal LDL < 100, ideally lower than 70.  She does not want to take a statin due to concern that it could contribute to ALS. Plans to have repeat lipid panel in a few months.  Encouraged her to consider non-statin options to lower cholesterol.   Hypertension: BP is well-controlled.  She is taking a higher dose of nebivolol for HR control with stable BP.   Aortic dilatation: Mild ascending aortic dilatation 40 mm on echo 06/05/2022.  She is scheduled for repeat echocardiogram September 2024.    Informed Consent   Shared Decision Making/Informed Consent The risks (stroke, cardiac arrhythmias rarely resulting in the need for a temporary or permanent pacemaker, skin irritation or burns and complications associated with conscious sedation including aspiration, arrhythmia, respiratory failure and death), benefits (restoration of normal sinus rhythm) and alternatives of a direct current cardioversion were explained in detail to Ms. Barrio and she agrees to proceed.       Dispo: Follow-up as needed/Keep your December follow-up with Dr. Cristal Deer  Signed, Eligha Bridegroom, NP-C

## 2023-03-15 NOTE — Progress Notes (Unsigned)
Cardiology Office Note:  .   Date:  03/16/2023  ID:  [name] [name], DOB [birth year], MRN [id number] PCP: Tracey Harries, MD  [facility] Providers Cardiologist:  None    Patient Profile: .      PMH PAF Onset 2020 after presenting with palpitations Chronic anticoagulation with Xarelto CHA2DS2-VASc score of 2 Considered to be a good candidate for frontline ablation per [facility] Hypertension Ascending aorta dilatation Ascending aortic dilatation measuring 40 mm on echo 06/05/2022  Last cardiology clinic visit was 02/24/23 with Dr. Shari Prows for recurrent A-fib.  She took extra dose of Bystolic. Was feeling more fatigued and "off."  Has mild palpitations but they are not burdensome. Agreeable to start Xarelto for stroke prevention and consider DCCV after 3 weeks of AC if she remains in a fib.        History of Present Illness: [name] [name]   [name] [name] is a very pleasant 62 y.o. female who is here today for follow-up of persistent atrial fibrillation. She has been taking nebivolol 20 mg BID along with diltiazem 30 mg usually 2 x per day to keep HR < 100 bpm. Gets lightheaded and short of breath with higher rates, is more aware of shortness of breath. Resting HR is usually 60 or below. No chest pain, orthopnea, PND, presyncope, or syncope.  She works as a Engineer, civil (consulting) in our cardiology clinic and occasionally notes ankle edema after a full day of work. It resolves overnight. BP previously well controlled on lisinopril but it caused cough. After stopping lisinopril, it has taken some time to get her BP well controlled. Currently on amlodipine, nebivolol and spironolactone with well controlled BP.  No recent lipid panel to review. She reports PCP has been monitoring. Her mother had ALS and a provider previously told her not to take statins. This is 3rd episode of a fib.  She previously converted and 2 days or less.   ROS: + shortness of breath, lightheadedness with a fib       Studies  Reviewed: [name] [name]   EKG Interpretation Date/Time:  Tuesday March 16 2023 14:47:14 EDT Ventricular Rate:  101 PR Interval:    QRS Duration:  64 QT Interval:  354 QTC Calculation: 459 R Axis:   87  Text Interpretation: Atrial fibrillation with rapid ventricular response Low voltage QRS Cannot rule out Anterior infarct (cited on or before 17-Apr-2022) When compared with ECG of 29-May-2022 08:36, Atrial fibrillation has replaced Sinus rhythm Vent. rate has increased BY  44 BPM Nonspecific T wave abnormality now evident in Inferior leads Nonspecific T wave abnormality now evident in Lateral leads Confirmed by Eligha Bridegroom [phone]) on 03/16/2023 2:55:13 PM     Risk Assessment/Calculations:    CHA2DS2-VASc Score = 2   This indicates a 2.2% annual risk of stroke. The patient's score is based upon: CHF History: 0 HTN History: 1 Diabetes History: 0 Stroke History: 0 Vascular Disease History: 0 Age Score: 0 Gender Score: 1             Physical Exam:   VS:  BP 110/70   Pulse (!) 102   Ht 5\' 6"  (1.676 m)   Wt 163 lb (73.9 kg)   SpO2 98%   BMI 26.31 kg/m    Wt Readings from Last 3 Encounters:  03/16/23 163 lb (73.9 kg)  02/24/23 165 lb (74.8 kg)  12/24/22 160 lb (72.6 kg)    GEN: Well nourished, well developed in no acute distress NECK: No JVD;  No carotid bruits CARDIAC: RRR, no murmurs, rubs, gallops RESPIRATORY:  Clear to auscultation without rales, wheezing or rhonchi  ABDOMEN: Soft, non-tender, non-distended EXTREMITIES:  No edema; No deformity     ASSESSMENT AND PLAN: .    Persistent atrial fibrillation: She remains in atrial fibrillation with ventricular rate of 101 bpm.  Has been taking additional dose of Bystolic along with low-dose diltiazem to keep HR < 100 bpm. No history of prior cardioversion. No missed doses of Xarelto.  Risks of cardioversion reviewed with patient and she would like to proceed.  We discussed that she typically takes Xarelto with a meal consisting of >  500 calories, which is necessary for absorption. Discussed with Pharm D and we encouraged her to take Xarelto on morning of DCCV despite being NPO.  She will follow-up in A-fib clinic or with APP following cardioversion.   Mixed hyperlipidemia: LDL 153, triglycerides 191 on 04/15/22.  We briefly discussed goal LDL < 100, ideally lower than 70.  She does not want to take a statin due to concern that it could contribute to ALS. Plans to have repeat lipid panel in a few months.  Encouraged her to consider non-statin options to lower cholesterol.   Hypertension: BP is well-controlled.  She is taking a higher dose of nebivolol for HR control with stable BP.   Aortic dilatation: Mild ascending aortic dilatation 40 mm on echo 06/05/2022.  She is scheduled for repeat echocardiogram September 2024.    Informed Consent   Shared Decision Making/Informed Consent The risks (stroke, cardiac arrhythmias rarely resulting in the need for a temporary or permanent pacemaker, skin irritation or burns and complications associated with conscious sedation including aspiration, arrhythmia, respiratory failure and death), benefits (restoration of normal sinus rhythm) and alternatives of a direct current cardioversion were explained in detail to Ms. Dalsing and she agrees to proceed.       Dispo: Follow-up as needed/Keep your December follow-up with Dr. Cristal Deer  Signed, Eligha Bridegroom, NP-C

## 2023-03-16 ENCOUNTER — Ambulatory Visit: Payer: Commercial Managed Care - PPO | Attending: Nurse Practitioner | Admitting: Nurse Practitioner

## 2023-03-16 ENCOUNTER — Encounter: Payer: Self-pay | Admitting: Nurse Practitioner

## 2023-03-16 VITALS — BP 110/70 | HR 102 | Ht 66.0 in | Wt 163.0 lb

## 2023-03-16 DIAGNOSIS — I77819 Aortic ectasia, unspecified site: Secondary | ICD-10-CM | POA: Diagnosis not present

## 2023-03-16 DIAGNOSIS — I10 Essential (primary) hypertension: Secondary | ICD-10-CM | POA: Diagnosis not present

## 2023-03-16 DIAGNOSIS — E782 Mixed hyperlipidemia: Secondary | ICD-10-CM | POA: Diagnosis not present

## 2023-03-16 DIAGNOSIS — I4819 Other persistent atrial fibrillation: Secondary | ICD-10-CM

## 2023-03-16 DIAGNOSIS — I480 Paroxysmal atrial fibrillation: Secondary | ICD-10-CM | POA: Diagnosis not present

## 2023-03-16 DIAGNOSIS — D6869 Other thrombophilia: Secondary | ICD-10-CM

## 2023-03-16 NOTE — Patient Instructions (Addendum)
Medication Instructions:  Your physician recommends that you continue on your current medications as directed. Please refer to the Current Medication list given to you today. *If you need a refill on your cardiac medications before your next appointment, please call your pharmacy*   Lab Work: [facility] If you have labs (blood work) drawn today and your tests are completely normal, you will receive your results only by: MyChart Message (if you have MyChart) OR A paper copy in the mail If you have any lab test that is abnormal or we need to change your treatment, we will call you to review the results.   Testing/Procedures: Your physician has recommended that you have a Cardioversion (DCCV). Electrical Cardioversion uses a jolt of electricity to your heart either through paddles or wired patches attached to your chest. This is a controlled, usually prescheduled, procedure. Defibrillation is done under light anesthesia in the hospital, and you usually go home the day of the procedure. This is done to get your heart back into a normal rhythm. You are not awake for the procedure. Please see the instruction sheet given to you today.   Follow-Up: At [facility], you and your health needs are our priority.  As part of our continuing mission to provide you with exceptional heart care, we have created designated Provider Care Teams.  These Care Teams include your primary Cardiologist (physician) and Advanced Practice Providers (APPs -  Physician Assistants and Nurse Practitioners) who all work together to provide you with the care you need, when you need it.  We recommend signing up for the patient portal called "MyChart".  Sign up information is provided on this After Visit Summary.  MyChart is used to connect with patients for Virtual Visits (Telemedicine).  Patients are able to view lab/test results, encounter notes, upcoming appointments, etc.  Non-urgent messages can be sent to your  provider as well.   To learn more about what you can do with MyChart, go to [email or web address].    Your next appointment:   As needed  Provider:   Eligha Bridegroom, NP       Other Instructions              Dear [name] [name]   You are scheduled for a Cardioversion on Tuesday, July 9 with Dr. Mayford Knife.  Please arrive at the [facility] (Main Entrance A) at [facility]: [address] [place], [place] [address] at 7:30 AM (This time is 1.5 hour(s) before your procedure to ensure your preparation). Free valet parking service is available. You will check in at ADMITTING. The support person will be asked to wait in the waiting room.  It is OK to have someone drop you off and come back when you are ready to be discharged.     DIET:  Nothing to eat or drink after midnight except a sip of water with medications (see medication instructions below)  MEDICATION INSTRUCTIONS: !!IF ANY NEW MEDICATIONS ARE STARTED AFTER TODAY, PLEASE NOTIFY YOUR PROVIDER AS SOON AS POSSIBLE!!  FYI: Medications such as Semaglutide (Ozempic, [place]), Tirzepatide (Mounjaro, Zepbound), Dulaglutide (Trulicity), etc ("GLP1 agonists") AND Canagliflozin (Invokana), Dapagliflozin (Farxiga), Empagliflozin (Jardiance), Ertugliflozin (Steglatro), Bexagliflozin [employer]) or any combination with one of these drugs such as Invokamet (Canagliflozin/Metformin), Synjardy (Empagliflozin/Metformin), etc ("SGLT2 inhibitors") must be held around the time of a procedure. This is not a comprehensive list of all of these drugs. Please review all of your medications and talk to your provider if you take  any one of these. If you are not sure, ask your provider.    Continue taking your anticoagulant (blood thinner): Rivaroxaban (Xarelto).  You will need to continue this after your procedure until you are told by your provider that it is safe to stop.    LABS:  Come to the lab at [facility] at [address] between the hours of 8:00 am and 4:30 pm. You do NOT have to be fasting.  FYI:  For your safety, and to allow [place] to monitor your vital signs accurately during the surgery/procedure we request: If you have artificial nails, gel coating, SNS etc, please have those removed prior to your surgery/procedure. Not having the nail coverings /polish removed may result in cancellation or delay of your surgery/procedure.  You must have a responsible person to drive you home and stay in the waiting area during your procedure. Failure to do so could result in cancellation.  Bring your insurance cards.  *Special Note: Every effort is made to have your procedure done on time. Occasionally there are emergencies that occur at the hospital that may cause delays. Please be patient if a delay does occur.

## 2023-03-16 NOTE — Addendum Note (Signed)
Addended by: Levi Aland on: 03/16/2023 04:48 PM   Modules accepted: Orders

## 2023-03-17 ENCOUNTER — Other Ambulatory Visit (HOSPITAL_COMMUNITY): Payer: Self-pay

## 2023-03-17 ENCOUNTER — Other Ambulatory Visit: Payer: Self-pay

## 2023-03-17 LAB — CBC
Hematocrit: 39.1 % (ref 34.0–46.6)
Hemoglobin: 13.1 g/dL (ref 11.1–15.9)
MCH: 30.8 pg (ref 26.6–33.0)
MCHC: 33.5 g/dL (ref 31.5–35.7)
MCV: 92 fL (ref 79–97)
Platelets: 291 10*3/uL (ref 150–450)
RBC: 4.25 x10E6/uL (ref 3.77–5.28)
RDW: 12.1 % (ref 11.7–15.4)
WBC: 8.2 10*3/uL (ref 3.4–10.8)

## 2023-03-17 LAB — BASIC METABOLIC PANEL
BUN/Creatinine Ratio: 18 (ref 12–28)
BUN: 12 mg/dL (ref 8–27)
CO2: 21 mmol/L (ref 20–29)
Calcium: 9.2 mg/dL (ref 8.7–10.3)
Chloride: 101 mmol/L (ref 96–106)
Creatinine, Ser: 0.67 mg/dL (ref 0.57–1.00)
Glucose: 93 mg/dL (ref 70–99)
Potassium: 4.1 mmol/L (ref 3.5–5.2)
Sodium: 137 mmol/L (ref 134–144)
eGFR: 98 mL/min/{1.73_m2} (ref >59)

## 2023-03-17 NOTE — Progress Notes (Signed)
Pt has been made aware of normal result and verbalized understanding.  jw

## 2023-03-19 ENCOUNTER — Other Ambulatory Visit (HOSPITAL_COMMUNITY): Payer: Self-pay

## 2023-03-22 ENCOUNTER — Encounter: Payer: Self-pay | Admitting: Physician Assistant

## 2023-03-22 ENCOUNTER — Encounter (HOSPITAL_COMMUNITY): Payer: Self-pay | Admitting: Cardiology

## 2023-03-22 ENCOUNTER — Ambulatory Visit: Payer: Commercial Managed Care - PPO | Admitting: Physician Assistant

## 2023-03-22 DIAGNOSIS — M1711 Unilateral primary osteoarthritis, right knee: Secondary | ICD-10-CM

## 2023-03-22 MED ORDER — HYALURONAN 88 MG/4ML IX SOSY
88.0000 mg | PREFILLED_SYRINGE | INTRA_ARTICULAR | Status: AC | PRN
Start: 2023-03-22 — End: 2023-03-22
  Administered 2023-03-22: 88 mg via INTRA_ARTICULAR

## 2023-03-22 MED ORDER — SODIUM HYALURONATE (VISCOSUP) 20 MG/2ML IX SOSY
20.00 mg | PREFILLED_SYRINGE | INTRA_ARTICULAR | Status: AC | PRN
Start: 2023-03-22 — End: 2023-03-22
  Administered 2023-03-22: 20 mg via INTRA_ARTICULAR

## 2023-03-22 NOTE — Pre-Procedure Instructions (Signed)
Spoke to patient on phone regarding cardioversion tomorrow:  Instructed to arrive at 8:00 am, NPO after midnight.  Confirmed patient has a ride home and responsible person to stay with her for 24 hours after the procedure.  Confirmed no missed doses of Xarelto and for her to take morning of procedure with a sip of water.   Take BP meds in the AM with a sip of water

## 2023-03-22 NOTE — Anesthesia Preprocedure Evaluation (Addendum)
Anesthesia Evaluation  Patient identified by MRN, date of birth, ID band Patient awake    Reviewed: Allergy & Precautions, NPO status , Patient's Chart, lab work & pertinent test results, reviewed documented beta blocker date and time   Airway Mallampati: II  TM Distance: >3 FB Neck ROM: Full    Dental no notable dental hx. (+) Teeth Intact, Caps, Dental Advisory Given   Pulmonary asthma , former smoker   Pulmonary exam normal breath sounds clear to auscultation       Cardiovascular hypertension, Pt. on medications and Pt. on home beta blockers + dysrhythmias Atrial Fibrillation  Rhythm:Irregular Rate:Normal     Neuro/Psych   Anxiety     negative neurological ROS     GI/Hepatic Neg liver ROS,GERD  ,,  Endo/Other  Hyperlipidemia  Renal/GU negative Renal ROS  negative genitourinary   Musculoskeletal  (+) Arthritis , Osteoarthritis,    Abdominal   Peds  Hematology Xarelto therapy   Anesthesia Other Findings   Reproductive/Obstetrics                             Anesthesia Physical Anesthesia Plan  ASA: 3  Anesthesia Plan: General   Post-op Pain Management: Minimal or no pain anticipated   Induction: Intravenous  PONV Risk Score and Plan: 3 and Treatment may vary due to age or medical condition  Airway Management Planned: Mask and Natural Airway  Additional Equipment: None  Intra-op Plan:   Post-operative Plan:   Informed Consent: I have reviewed the patients History and Physical, chart, labs and discussed the procedure including the risks, benefits and alternatives for the proposed anesthesia with the patient or authorized representative who has indicated his/her understanding and acceptance.     Dental advisory given  Plan Discussed with: CRNA and Anesthesiologist  Anesthesia Plan Comments:        Anesthesia Quick Evaluation

## 2023-03-22 NOTE — Progress Notes (Signed)
   Procedure Note  Patient: [name] [name]             Date of Birth: May 29, 1961           MRN: [id number]             Visit Date: 03/22/2023 HPI: [name] comes in today for her second Euflexxa injection 3.  She states that overall her knee is doing well.  She has had no adverse effects to the injection.  She does ask about her left hip which she is having some groin pain at times but states that it is not severe.  Physical exam: Right knee good range of motion.  No abnormal warmth erythema. Left hip good range of motion without pain.  Nontender over the trochanteric region. Procedures: Visit Diagnoses:  1. Primary osteoarthritis of right knee     Large Joint Inj: R knee on 03/22/2023 5:54 PM Indications: pain Details: 22 G 1.5 in needle, anterolateral approach  Arthrogram: No  Medications: 88 mg Hyaluronan 88 MG/4ML; 20 mg Sodium Hyaluronate (Viscosup) 20 MG/2ML Outcome: tolerated well, no immediate complications Procedure, treatment alternatives, risks and benefits explained, specific risks discussed. Consent was given by the patient. Immediately prior to procedure a time out was called to verify the correct patient, procedure, equipment, support staff and site/side marked as required. Patient was prepped and draped in the usual sterile fashion.     Plan: Will see her back in 1 week and at that time we will obtain an AP pelvis lateral view of her left hip to evaluate her joint space.  Will also inject her right knee with the third Euflexxa injection.

## 2023-03-23 ENCOUNTER — Ambulatory Visit (HOSPITAL_BASED_OUTPATIENT_CLINIC_OR_DEPARTMENT_OTHER): Payer: Commercial Managed Care - PPO | Admitting: Anesthesiology

## 2023-03-23 ENCOUNTER — Ambulatory Visit (HOSPITAL_COMMUNITY)
Admission: RE | Admit: 2023-03-23 | Discharge: 2023-03-23 | Disposition: A | Discharge status: Home / Self Care | Payer: Commercial Managed Care - PPO | Attending: Cardiology | Admitting: Cardiology

## 2023-03-23 ENCOUNTER — Encounter (HOSPITAL_COMMUNITY)
Admission: RE | Disposition: A | Discharge status: Home / Self Care | Payer: Self-pay | Source: Home / Self Care | Attending: Cardiology

## 2023-03-23 ENCOUNTER — Ambulatory Visit (HOSPITAL_COMMUNITY): Payer: Commercial Managed Care - PPO | Admitting: Anesthesiology

## 2023-03-23 ENCOUNTER — Encounter (HOSPITAL_COMMUNITY): Payer: Self-pay | Admitting: Cardiology

## 2023-03-23 ENCOUNTER — Other Ambulatory Visit: Payer: Self-pay

## 2023-03-23 DIAGNOSIS — Z7901 Long term (current) use of anticoagulants: Secondary | ICD-10-CM | POA: Insufficient documentation

## 2023-03-23 DIAGNOSIS — E782 Mixed hyperlipidemia: Secondary | ICD-10-CM | POA: Insufficient documentation

## 2023-03-23 DIAGNOSIS — I4819 Other persistent atrial fibrillation: Secondary | ICD-10-CM | POA: Diagnosis not present

## 2023-03-23 DIAGNOSIS — I10 Essential (primary) hypertension: Secondary | ICD-10-CM | POA: Diagnosis not present

## 2023-03-23 DIAGNOSIS — J45909 Unspecified asthma, uncomplicated: Secondary | ICD-10-CM

## 2023-03-23 DIAGNOSIS — I480 Paroxysmal atrial fibrillation: Secondary | ICD-10-CM | POA: Diagnosis not present

## 2023-03-23 DIAGNOSIS — I77819 Aortic ectasia, unspecified site: Secondary | ICD-10-CM | POA: Insufficient documentation

## 2023-03-23 DIAGNOSIS — Z87891 Personal history of nicotine dependence: Secondary | ICD-10-CM | POA: Diagnosis not present

## 2023-03-23 HISTORY — PX: CARDIOVERSION: SHX1299

## 2023-03-23 SURGERY — CARDIOVERSION
Anesthesia: General

## 2023-03-23 MED ORDER — LIDOCAINE 2% (20 MG/ML) 5 ML SYRINGE
INTRAMUSCULAR | Status: DC | PRN
  Administered 2023-03-23: 60 mg via INTRAVENOUS

## 2023-03-23 MED ORDER — SODIUM CHLORIDE 0.9 % IV SOLN: INTRAVENOUS | Status: DC

## 2023-03-23 MED ORDER — PROPOFOL 10 MG/ML IV BOLUS
INTRAVENOUS | Status: DC | PRN
  Administered 2023-03-23: 70 mg via INTRAVENOUS

## 2023-03-23 SURGICAL SUPPLY — 1 items: ELECT DEFIB PAD ADLT CADENCE (PAD) ×1 IMPLANT

## 2023-03-23 NOTE — Interval H&P Note (Signed)
History and Physical Interval Note:  03/23/2023 8:18 AM  [name] [name]  has presented today for surgery, with the diagnosis of AFIB.  The various methods of treatment have been discussed with the patient and family. After consideration of risks, benefits and other options for treatment, the patient has consented to  Procedure(s): CARDIOVERSION (N/A) as a surgical intervention.  The patient's history has been reviewed, patient examined, no change in status, stable for surgery.  I have reviewed the patient's chart and labs.  Questions were answered to the patient's satisfaction.     Armanda Magic

## 2023-03-23 NOTE — Transfer of Care (Signed)
Immediate Anesthesia Transfer of Care Note  Patient: [name] [name]  Procedure(s) Performed: CARDIOVERSION  Patient Location: Cath Lab  Anesthesia Type:General  Level of Consciousness: drowsy and patient cooperative  Airway & Oxygen Therapy: Patient Spontanous Breathing and Patient connected to nasal cannula oxygen  Post-op Assessment: Report given to RN and Post -op Vital signs reviewed and stable  Post vital signs: Reviewed and stable  Last Vitals:  Vitals Value Taken Time  BP 121/80 03/23/23 0904  Temp    Pulse 66 03/23/23 0907  Resp 23 03/23/23 0907  SpO2 86 % 03/23/23 0907    Last Pain:  Vitals:   03/23/23 0811  TempSrc: Temporal         Complications: No notable events documented.

## 2023-03-23 NOTE — CV Procedure (Signed)
    Electrical Cardioversion Procedure Note [name] [name] [id number] 1961/05/02  Procedure: Electrical Cardioversion Indications:  Atrial Fibrillation  Time Out: Verified patient identification, verified procedure,medications/allergies/relevent history reviewed, required imaging and test results available.  Performed  Procedure Details  The patient was NPO after midnight. Anesthesia was administered at the beside  by [facility] with 70mg  of propofol and 60mg  of Lidocaine.  Cardioversion was done with synchronized biphasic defibrillation with AP pads with 150watts.  The patient converted to normal sinus rhythm. The patient tolerated the procedure well   IMPRESSION:  Successful cardioversion of atrial fibrillation    [name] [name] 03/23/2023, 8:19 AM

## 2023-03-23 NOTE — Discharge Instructions (Signed)

## 2023-03-23 NOTE — Anesthesia Postprocedure Evaluation (Signed)
Anesthesia Post Note  Patient: [name] [name]  Procedure(s) Performed: CARDIOVERSION     Patient location during evaluation: PACU Anesthesia Type: General Level of consciousness: awake and alert and oriented Pain management: pain level controlled Vital Signs Assessment: post-procedure vital signs reviewed and stable Respiratory status: spontaneous breathing, nonlabored ventilation and respiratory function stable Cardiovascular status: blood pressure returned to baseline and stable Postop Assessment: no apparent nausea or vomiting Anesthetic complications: no   No notable events documented.  Last Vitals:  Vitals:   03/23/23 0907 03/23/23 0915  BP:    Pulse: 66   Resp: (!) 23   Temp:  36.8 C  SpO2: (!) 86%     Last Pain:  Vitals:   03/23/23 0915  TempSrc: Temporal  PainSc: 0-No pain                 Tinia Oravec A.

## 2023-03-24 ENCOUNTER — Encounter (HOSPITAL_COMMUNITY): Payer: Self-pay | Admitting: Cardiology

## 2023-03-30 ENCOUNTER — Telehealth: Payer: Self-pay | Admitting: *Deleted

## 2023-03-30 DIAGNOSIS — I4819 Other persistent atrial fibrillation: Secondary | ICD-10-CM

## 2023-03-30 NOTE — Telephone Encounter (Addendum)
-----   Message from Angeline H sent at 03/30/2023 11:58 AM EDT ----- Regarding: RE: REFER TO EP DR. Lalla Brothers PER DR. Theressa Millard,  I spoke with [place] and scheduled her with CL on 08/19 at 2:15 pm ----- Message ----- From: Loa Socks, LPN Sent: 1/61/0960   9:36 AM EDT To: Ronn Melena; Angeline S Hammer Subject: REFER TO EP DR. Celesta Aver DR. Shari Prows      Dr. Shari Prows referred this pt to see Dr. Lalla Brothers EP for afib management  Referral is in.  Pt aware you will call her to arrange this appt.    Can you please arrange and shoot me the date thereafter?  Thanks, [employer]

## 2023-03-30 NOTE — Telephone Encounter (Signed)
Per Dr. Shari Prows, she wants to refer this pt EP to see Dr. Lalla Brothers, for afib management.   Referral placed.  Pt is aware that I will have EP Scheduler reach out to her to arrange this appt.   Pt verbalized understanding and agrees with this plan.

## 2023-03-31 ENCOUNTER — Encounter: Payer: Self-pay | Admitting: Orthopaedic Surgery

## 2023-03-31 ENCOUNTER — Ambulatory Visit: Payer: Commercial Managed Care - PPO | Admitting: Orthopaedic Surgery

## 2023-03-31 ENCOUNTER — Other Ambulatory Visit (INDEPENDENT_AMBULATORY_CARE_PROVIDER_SITE_OTHER): Payer: Commercial Managed Care - PPO

## 2023-03-31 DIAGNOSIS — M25552 Pain in left hip: Secondary | ICD-10-CM | POA: Diagnosis not present

## 2023-03-31 DIAGNOSIS — M1711 Unilateral primary osteoarthritis, right knee: Secondary | ICD-10-CM

## 2023-03-31 MED ORDER — HYALURONAN 88 MG/4ML IX SOSY
88.0000 mg | PREFILLED_SYRINGE | INTRA_ARTICULAR | Status: AC | PRN
Start: 2023-03-31 — End: 2023-03-31
  Administered 2023-03-31: 88 mg via INTRA_ARTICULAR

## 2023-03-31 MED ORDER — SODIUM HYALURONATE (VISCOSUP) 20 MG/2ML IX SOSY
20.0000 mg | PREFILLED_SYRINGE | INTRA_ARTICULAR | Status: AC | PRN
Start: 2023-03-31 — End: 2023-03-31
  Administered 2023-03-31: 20 mg via INTRA_ARTICULAR

## 2023-03-31 NOTE — Progress Notes (Signed)
Office Visit Note   Patient: [name] [name]           Date of Birth: Mar 15, 1961           MRN: [id number] Visit Date: 03/31/2023              Requested by: Tracey Harries, MD [address] [place],  [place] [address] PCP: Tracey Harries, MD   Assessment & Plan: Visit Diagnoses:  1. Pain in left hip   2. Primary osteoarthritis of right knee     Plan: She will follow-up with [place] in regards to her right knee as needed.  She knows to wait at least 6 months between viscosupplementation injections.  In regards to the hip offered her an intra-articular injection.  This would be for both diagnostic and hopefully therapeutic purposes.  At this time she defers.  She may call the office if the hip pain becomes worse such that she would want the intra-articular injection.  Questions were encouraged and answered at length  Follow-Up Instructions: Return if symptoms worsen or fail to improve.   Orders:  Orders Placed This Encounter  Procedures   Large Joint Inj   XR HIP UNILAT W OR W/O PELVIS 2-3 VIEWS LEFT   No orders of the defined types were placed in this encounter.     Procedures: Large Joint Inj on 03/31/2023 9:22 AM Indications: pain Details: 22 G 1.5 in needle, anterolateral approach  Arthrogram: No  Medications: 88 mg Hyaluronan 88 MG/4ML; 20 mg Sodium Hyaluronate (Viscosup) 20 MG/2ML Outcome: tolerated well, no immediate complications Procedure, treatment alternatives, risks and benefits explained, specific risks discussed. Consent was given by the patient. Immediately prior to procedure a time out was called to verify the correct patient, procedure, equipment, support staff and site/side marked as required. Patient was prepped and draped in the usual sterile fashion.       Clinical Data: No additional findings.   Subjective: Chief Complaint  Patient presents with   Right Knee - Follow-up    Euflexxa #3    HPI [name] [name] comes in today for her third  Euflexxa injection right knee.  She states overall the knee is doing well.  She is continuing to have some pain in her left hip and groin area.  Pain is better whenever she "stretches it out".  Pain is worse with prolonged sitting.  No known injury to the hip.  Review of Systems See HPI  Objective: Vital Signs: There were no vitals taken for this visit.  Physical Exam Pulmonary:     Effort: Pulmonary effort is normal.  Neurological:     Mental Status: She is alert and oriented to person, place, and time.  Psychiatric:        Mood and Affect: Mood normal.     Ortho Exam Right knee : Good range of motion.  No abnormal warmth erythema or effusion Left hip good range of motion without pain.  Ambulates without any assistive device. Specialty Comments:  No specialty comments available.  Imaging: XR HIP UNILAT W OR W/O PELVIS 2-3 VIEWS LEFT  Result Date: 03/31/2023 AP pelvis and lateral view left hip: No acute fractures. Left hip well maintained weight bearing surface. Small osteophyte inferior pole of femoral head.     PMFS History: Patient Active Problem List   Diagnosis Date Noted   Persistent atrial fibrillation (HCC) 03/23/2023   Vaginitis 11/11/2022   Paroxysmal atrial fibrillation (HCC) 05/29/2022   COVID-19 virus infection 11/11/2021  Pain of ulnar side of wrist 10/27/2021   History of atrial fibrillation 05/28/2020   Pain in right knee 02/09/2020   Asthma, mild intermittent 06/21/2019   Vitamin D insufficiency 04/11/2019   History of basal cell carcinoma (BCC) 12/06/2017   Menopausal syndrome 07/25/2017   Primary osteoarthritis of right knee 01/15/2017   Face lesion 05/11/2016   Intrinsic eczema 03/05/2016   Hypertriglyceridemia 05/14/2015   Anaphylaxis due to insect venom 08/20/2014   Annual physical exam 08/20/2014   Essential hypertension 12/06/2013   Hx of abnormal Pap smear 01/15/2012   Perimenopausal vasomotor symptoms 01/15/2012   S/P hysterectomy  01/15/2012   OTITIS MEDIA, RIGHT 04/26/2008   Seasonal allergic rhinitis 04/26/2008   Asthmatic bronchitis with acute exacerbation 04/26/2008   COLITIS 04/26/2008   Past Medical History:  Diagnosis Date   Allergic rhinitis, cause unspecified    Allergy    SEASONAL   Anxiety    Arthritis    KNEES,HANDS   Cancer (HCC)    BASAL,SKIN   Extrinsic asthma, unspecified    Hyperlipidemia    Hypertension    IBS (irritable bowel syndrome)    Other and unspecified noninfectious gastroenteritis and colitis(558.9)    25 years ago    Family History  Problem Relation Age of Onset   ALS Mother    Coronary artery disease Father    Hypertension Father    Hypertension Sister    Colon cancer Maternal Uncle 60   Hypertension Other        sibling   Bipolar disorder Other        sibling   Colon polyps Neg Hx    Crohn's disease Neg Hx    Esophageal cancer Neg Hx    Rectal cancer Neg Hx    Stomach cancer Neg Hx    Ulcerative colitis Neg Hx     Past Surgical History:  Procedure Laterality Date   BREAST REDUCTION SURGERY     BUNIONECTOMY  2004   Bil   CARDIOVERSION N/A 03/23/2023   Procedure: CARDIOVERSION;  Surgeon: Quintella Reichert, MD;  Location: [facility];  Service: Cardiovascular;  Laterality: N/A;   COLONOSCOPY     KNEE ARTHROSCOPY     right knee   rhinoplasty     TOTAL ABDOMINAL HYSTERECTOMY  2004   WISDOM TOOTH EXTRACTION     Social History   Occupational History   Not on file  Tobacco Use   Smoking status: Former    Current packs/day: 0.00    Average packs/day: 0.2 packs/day for 10.0 years (2.0 ttl pk-yrs)    Types: Cigarettes    Start date: 09/14/1981    Quit date: 09/15/1991    Years since quitting: 31.5   Smokeless tobacco: Never   Tobacco comments:    Former smoker 04/17/22  Vaping Use   Vaping status: Never Used  Substance and Sexual Activity   Alcohol use: Not Currently    Comment: 2-3 beers 2-3 days a week 04/17/22   Drug use: No   Sexual activity:  Never    Partners: Male    Birth control/protection: None

## 2023-04-02 ENCOUNTER — Telehealth: Payer: Self-pay

## 2023-04-02 ENCOUNTER — Other Ambulatory Visit (HOSPITAL_COMMUNITY): Payer: Self-pay

## 2023-04-02 MED ORDER — NEBIVOLOL HCL 20 MG PO TABS
20.0000 mg | ORAL_TABLET | Freq: Every day | ORAL | 3 refills | Status: DC
  Filled 2023-04-02 – 2023-04-08 (×6): qty 90, 90d supply, fill #0
  Filled 2023-06-22: qty 30, 30d supply, fill #1
  Filled 2023-07-30: qty 30, 30d supply, fill #2
  Filled 2023-08-24: qty 90, 90d supply, fill #3
  Filled 2023-10-31 – 2023-11-14 (×2): qty 90, 90d supply, fill #4
  Filled 2024-02-08: qty 30, 30d supply, fill #5

## 2023-04-02 NOTE — Telephone Encounter (Signed)
Refill request sent to patient's preferred pharmacy.

## 2023-04-02 NOTE — Telephone Encounter (Signed)
-----   Message from Armanda Magic sent at 04/02/2023 11:21 AM EDT ----- Regarding: RE: Bystolic refill yes ----- Message ----- From: Luellen Pucker, RN Sent: 04/02/2023   9:55 AM EDT To: Quintella Reichert, MD; Meriam Sprague, MD Subject: Bystolic refill                                Patient needs bystolic refill, it looks to me like she is transferring her general cardiology care to Dr. Cristal Deer at [facility] but is it ok to refill now? Alcario Drought

## 2023-04-05 ENCOUNTER — Other Ambulatory Visit: Payer: Self-pay | Admitting: Cardiology

## 2023-04-05 ENCOUNTER — Other Ambulatory Visit: Payer: Self-pay

## 2023-04-05 ENCOUNTER — Other Ambulatory Visit (HOSPITAL_COMMUNITY): Payer: Self-pay

## 2023-04-05 MED ORDER — SPIRONOLACTONE 50 MG PO TABS: 50.0000 mg | ORAL_TABLET | Freq: Every day | ORAL | Status: DC

## 2023-04-06 ENCOUNTER — Other Ambulatory Visit (HOSPITAL_COMMUNITY): Payer: Self-pay

## 2023-04-06 ENCOUNTER — Other Ambulatory Visit: Payer: Self-pay | Admitting: Cardiology

## 2023-04-06 ENCOUNTER — Other Ambulatory Visit: Payer: Self-pay

## 2023-04-06 MED ORDER — SPIRONOLACTONE 50 MG PO TABS
50.0000 mg | ORAL_TABLET | Freq: Every day | ORAL | 3 refills | Status: DC
  Filled 2023-04-06: qty 90, 90d supply, fill #0

## 2023-04-07 ENCOUNTER — Other Ambulatory Visit (HOSPITAL_COMMUNITY): Payer: Self-pay

## 2023-04-08 ENCOUNTER — Other Ambulatory Visit (HOSPITAL_COMMUNITY): Payer: Self-pay

## 2023-04-10 ENCOUNTER — Other Ambulatory Visit (HOSPITAL_COMMUNITY): Payer: Self-pay

## 2023-04-25 ENCOUNTER — Other Ambulatory Visit: Payer: Self-pay

## 2023-04-28 ENCOUNTER — Other Ambulatory Visit: Payer: Self-pay | Admitting: *Deleted

## 2023-04-28 ENCOUNTER — Other Ambulatory Visit (HOSPITAL_COMMUNITY): Payer: Self-pay

## 2023-04-28 MED ORDER — AMLODIPINE BESYLATE 2.5 MG PO TABS
2.5000 mg | ORAL_TABLET | Freq: Every day | ORAL | 1 refills | Status: DC
  Filled 2023-04-28: qty 90, 90d supply, fill #0
  Filled 2023-07-28: qty 90, 90d supply, fill #1

## 2023-04-28 NOTE — Progress Notes (Signed)
Former Dr. Shari Prows pt and will now be establishing her care with Dr. Cristal Deer at our [facility] location.  Pt reached out to the office for a refill of her amlodipine.  Refill request sent to the pts confirmed pharmacy of choice.    Pt made aware of this and was gracious for all the assistance provided.

## 2023-04-29 ENCOUNTER — Encounter: Payer: Self-pay | Admitting: Podiatry

## 2023-04-29 ENCOUNTER — Ambulatory Visit: Payer: Commercial Managed Care - PPO | Admitting: Podiatry

## 2023-04-29 DIAGNOSIS — M7671 Peroneal tendinitis, right leg: Secondary | ICD-10-CM

## 2023-04-29 MED ORDER — TRIAMCINOLONE ACETONIDE 10 MG/ML IJ SUSP
10.0000 mg | Freq: Once | INTRAMUSCULAR | Status: AC
Start: 2023-04-29 — End: 2023-04-29
  Administered 2023-04-29: 10 mg via INTRA_ARTICULAR

## 2023-04-29 NOTE — Progress Notes (Signed)
Subjective:   Patient ID: [name] [name], female   DOB: 62 y.o.   MRN: [id number]   HPI Patient has started to develop discomfort in the outside of the right foot and states she does not remember specific injury   ROS      Objective:  Physical Exam  Neurovascular status intact inflammation of the lateral side right foot with fluid buildup at the insertion base of fifth metatarsal no tendon dysfunction noted     Assessment:  Peroneal tendinitis right foot at insertion     Plan:  H&P reviewed condition and x-ray and advised on injection explained risk patient wants injection sterile prep injected the sheath as it inserts base of fifth metatarsal 3 mg dexamethasone Kenalog 5 mg Xylocaine advised on ice and support shoes reappoint as needed  X-rays were negative for signs of fracture or bony injury associated with condition

## 2023-05-03 ENCOUNTER — Ambulatory Visit: Payer: Commercial Managed Care - PPO | Admitting: Cardiology

## 2023-05-04 ENCOUNTER — Other Ambulatory Visit (HOSPITAL_COMMUNITY): Payer: Self-pay

## 2023-05-04 MED ORDER — PAROXETINE HCL 10 MG PO TABS
10.0000 mg | ORAL_TABLET | Freq: Every day | ORAL | 3 refills | Status: DC
  Filled 2023-05-04 – 2023-05-13 (×3): qty 90, 90d supply, fill #0
  Filled 2023-07-28: qty 90, 90d supply, fill #1
  Filled 2023-10-31: qty 90, 90d supply, fill #2
  Filled 2024-02-08: qty 90, 90d supply, fill #3
  Filled ????-??-??: fill #0

## 2023-05-06 ENCOUNTER — Other Ambulatory Visit: Payer: Self-pay | Admitting: Internal Medicine

## 2023-05-06 ENCOUNTER — Other Ambulatory Visit: Payer: Self-pay

## 2023-05-13 ENCOUNTER — Other Ambulatory Visit (HOSPITAL_COMMUNITY): Payer: Self-pay

## 2023-05-13 MED ORDER — FLUTICASONE-SALMETEROL 100-50 MCG/ACT IN AEPB
INHALATION_SPRAY | RESPIRATORY_TRACT | 6 refills | Status: DC
  Filled 2023-05-13: qty 60, fill #0
  Filled 2023-05-13: qty 60, 30d supply, fill #0
  Filled 2023-08-23: qty 60, 30d supply, fill #1
  Filled 2023-10-06: qty 60, 30d supply, fill #2
  Filled 2023-10-31: qty 60, 30d supply, fill #3
  Filled 2024-01-19: qty 60, 30d supply, fill #4
  Filled 2024-02-08 – 2024-04-03 (×2): qty 60, 30d supply, fill #5

## 2023-05-20 ENCOUNTER — Ambulatory Visit: Payer: Commercial Managed Care - PPO | Attending: Cardiology | Admitting: Cardiology

## 2023-05-20 ENCOUNTER — Encounter: Payer: Self-pay | Admitting: Cardiology

## 2023-05-20 VITALS — BP 122/80 | HR 64 | Ht 66.0 in | Wt 167.8 lb

## 2023-05-20 DIAGNOSIS — I4819 Other persistent atrial fibrillation: Secondary | ICD-10-CM

## 2023-05-20 DIAGNOSIS — I10 Essential (primary) hypertension: Secondary | ICD-10-CM | POA: Diagnosis not present

## 2023-05-20 NOTE — Patient Instructions (Signed)
Medication Instructions:  Your physician recommends that you continue on your current medications as directed. Please refer to the Current Medication list given to you today.  *If you need a refill on your cardiac medications before your next appointment, please call your pharmacy*  Follow-Up: At [facility], you and your health needs are our priority.  As part of our continuing mission to provide you with exceptional heart care, we have created designated Provider Care Teams.  These Care Teams include your primary Cardiologist (physician) and Advanced Practice Providers (APPs -  Physician Assistants and Nurse Practitioners) who all work together to provide you with the care you need, when you need it.  Your next appointment:   1 year  Provider:   You may see CAMERON T LAMBERT, MD or one of the following Advanced Practice Providers on your designated Care Team:   Renee Ursuy, PA-C Michael "Andy" Tillery, PA-C Suzann Riddle, NP Brandi Ollis, NP   

## 2023-05-20 NOTE — Progress Notes (Signed)
Electrophysiology Office Note:    Date:  05/20/2023   ID:  Shakiria, Perl [birth year], MRN [id number]  [facility] Cardiologist:  None  [facility] Electrophysiologist:  Lanier Prude, MD   Referring MD: Meriam Sprague, MD   Chief Complaint: Atrial fibrillation  History of Present Illness:    [name] [name] is a 62 y.o. femalewho I am seeing today for an evaluation of atrial fibrillation at the request of Dr. Shari Prows.  The patient was last seen by Dr. Shari Prows on February 24, 2023.  The patient has a medical history that includes hypertension, paroxysmal atrial fibrillation.  She was diagnosed with A-fib in 2020 after she presented to her primary care physician with palpitations.  She had more episodes of atrial fibrillation in August 2023 confirmed by her smart watch.  She spontaneously converted later in the day.  She contacted the clinic again on February 23, 2023 with more symptomatic atrial fibrillation.  While in atrial fibrillation she feels "off".  She has increased fatigue.  She is on Xarelto for stroke prophylaxis.  She saw Eligha Bridegroom, NP on March 16, 2023.  At that appointment she remained in atrial fibrillation.  Cardioversion was arranged.  She is referred to discuss rhythm control for her atrial fibrillation.  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  Her atrial fibrillation episodes are rare but poorly tolerated.  She currently takes Xarelto for stroke prophylaxis without bleeding issues.  She thinks that she will eventually want to pursue catheter ablation but would like to get closer to her retirement date.     Their past medical, social and family history was reveiwed.   ROS:   Please see the history of present illness.    All other systems reviewed and are negative.  EKGs/Labs/Other Studies Reviewed:    The following studies were reviewed today:  June 05, 2022 echo EF 60 to 65% RV normal Mildly dilated left atrium Trivial  MR Trivial AI  March 16, 2023 EKG shows atrial fibrillation with a ventricular rate of 101 bpm.  Low voltage    EKG Interpretation Date/Time:  Thursday May 20 2023 14:03:25 EDT Ventricular Rate:  64 PR Interval:  182 QRS Duration:  74 QT Interval:  416 QTC Calculation: 429 R Axis:   62  Text Interpretation: Normal sinus rhythm Low voltage QRS Confirmed by Steffanie Dunn [phone]) on 05/20/2023 2:15:22 PM    Physical Exam:    VS:  BP 122/80   Pulse 64   Ht 5\' 6"  (1.676 m)   Wt 167 lb 12.8 oz (76.1 kg)   SpO2 98%   BMI 27.08 kg/m     Wt Readings from Last 3 Encounters:  05/20/23 167 lb 12.8 oz (76.1 kg)  03/23/23 160 lb (72.6 kg)  03/16/23 163 lb (73.9 kg)     GEN:  Well nourished, well developed in no acute distress CARDIAC: RRR, no murmurs, rubs, gallops RESPIRATORY:  Clear to auscultation without rales, wheezing or rhonchi       ASSESSMENT AND PLAN:    1. Persistent atrial fibrillation (HCC)   2. Essential hypertension     #Persistent atrial fibrillation Symptomatic.  Has required cardioversion in the past.  On Xarelto for stroke prophylaxis.  On diltiazem as needed and daily nebivolol.  Discussed treatment options today for AF including antiarrhythmic drug therapy and ablation. Discussed risks, recovery and likelihood of success with each treatment strategy. Risk, benefits, and alternatives to EP study and ablation for afib  were discussed. These risks include but are not limited to stroke, bleeding, vascular damage, tamponade, perforation, damage to the esophagus, lungs, phrenic nerve and other structures, pulmonary vein stenosis, worsening renal function, coronary vasospasm and death.  Discussed potential need for repeat ablation procedures and antiarrhythmic drugs after an initial ablation. The patient understands these risks.  She would like some time to think about her options and to see how frequent her atrial fibrillation episodes are which is very  reasonable.  Should she elect to proceed with catheter ablation, Carto, ICE, anesthesia are requested for the procedure.  Will also obtain CT PV protocol prior to the procedure to exclude LAA thrombus and further evaluate atrial anatomy.  #Hypertension At goal today.  Recommend checking blood pressures 1-2 times per week at home and recording the values.  Recommend bringing these recordings to the primary care physician.  Follow-up 1 year or sooner as needed.       Signed, Rossie Muskrat. Lalla Brothers, MD, [facility], [facility] 05/20/2023 2:16 PM    Electrophysiology [facility]

## 2023-05-24 ENCOUNTER — Other Ambulatory Visit: Payer: Self-pay | Admitting: Oncology

## 2023-05-24 ENCOUNTER — Other Ambulatory Visit (HOSPITAL_COMMUNITY): Payer: 59

## 2023-05-24 DIAGNOSIS — Z006 Encounter for examination for normal comparison and control in clinical research program: Secondary | ICD-10-CM

## 2023-05-25 ENCOUNTER — Ambulatory Visit (HOSPITAL_COMMUNITY): Payer: Commercial Managed Care - PPO

## 2023-05-26 ENCOUNTER — Other Ambulatory Visit (HOSPITAL_COMMUNITY): Payer: Self-pay

## 2023-05-26 DIAGNOSIS — I10 Essential (primary) hypertension: Secondary | ICD-10-CM | POA: Diagnosis not present

## 2023-05-26 DIAGNOSIS — H524 Presbyopia: Secondary | ICD-10-CM | POA: Diagnosis not present

## 2023-05-26 DIAGNOSIS — H52223 Regular astigmatism, bilateral: Secondary | ICD-10-CM | POA: Diagnosis not present

## 2023-05-26 DIAGNOSIS — Z0000 Encounter for general adult medical examination without abnormal findings: Secondary | ICD-10-CM | POA: Diagnosis not present

## 2023-05-26 DIAGNOSIS — H5213 Myopia, bilateral: Secondary | ICD-10-CM | POA: Diagnosis not present

## 2023-05-26 DIAGNOSIS — E559 Vitamin D deficiency, unspecified: Secondary | ICD-10-CM | POA: Diagnosis not present

## 2023-06-07 ENCOUNTER — Encounter: Payer: Self-pay | Admitting: Orthopaedic Surgery

## 2023-06-07 ENCOUNTER — Ambulatory Visit: Payer: Commercial Managed Care - PPO | Admitting: Orthopaedic Surgery

## 2023-06-07 DIAGNOSIS — M25561 Pain in right knee: Secondary | ICD-10-CM

## 2023-06-07 DIAGNOSIS — G8929 Other chronic pain: Secondary | ICD-10-CM

## 2023-06-07 DIAGNOSIS — M1711 Unilateral primary osteoarthritis, right knee: Secondary | ICD-10-CM | POA: Diagnosis not present

## 2023-06-07 MED ORDER — LIDOCAINE HCL 1 % IJ SOLN
3.0000 mL | INTRAMUSCULAR | Status: AC | PRN
Start: 2023-06-07 — End: 2023-06-07
  Administered 2023-06-07: 3 mL

## 2023-06-07 MED ORDER — METHYLPREDNISOLONE ACETATE 40 MG/ML IJ SUSP
40.0000 mg | INTRAMUSCULAR | Status: AC | PRN
Start: 2023-06-07 — End: 2023-06-07
  Administered 2023-06-07: 40 mg via INTRA_ARTICULAR

## 2023-06-07 NOTE — Progress Notes (Signed)
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  We have seen her before for her right knee.  She last had hyaluronic acid injections with Euflexxa and just in July.  She says that has not helped her as much as she wants it to help.  She is traveling out of the country soon and is requesting steroid injection today.  Most of her pain is the medial aspect of the knee joint and the inferior pole the patella on that right side with no known injury.  On exam she does have a moderate effusion.  I was able to aspirate 30 cc of clear yellow fluid from her right knee and then place a steroid injection in her right knee per her request.  She tolerated this very well.  Follow-up is as needed.  If he gets to the point where she would like to consider knee replacement that something we could always consider down the road.  She is only 84 and active but she is on Xarelto as well.  All question concerns were answered and addressed.    Procedure Note  Patient: [name] [name]             Date of Birth: 1961-06-14           MRN: [id number]             Visit Date: 06/07/2023  Procedures: Visit Diagnoses:  1. Primary osteoarthritis of right knee   2. Chronic pain of right knee     Large Joint Inj: R knee on 06/07/2023 3:11 PM Indications: diagnostic evaluation and pain Details: 22 G 1.5 in needle, superolateral approach  Arthrogram: No  Medications: 3 mL lidocaine 1 %; 40 mg methylPREDNISolone acetate 40 MG/ML Outcome: tolerated well, no immediate complications Procedure, treatment alternatives, risks and benefits explained, specific risks discussed. Consent was given by the patient. Immediately prior to procedure a time out was called to verify the correct patient, procedure, equipment, support staff and site/side marked as required. Patient was prepped and draped in the usual sterile fashion.

## 2023-06-10 ENCOUNTER — Other Ambulatory Visit: Payer: Self-pay

## 2023-06-10 ENCOUNTER — Other Ambulatory Visit (HOSPITAL_COMMUNITY): Payer: Self-pay

## 2023-06-10 ENCOUNTER — Encounter: Payer: Self-pay | Admitting: Internal Medicine

## 2023-06-14 ENCOUNTER — Other Ambulatory Visit (HOSPITAL_COMMUNITY): Payer: Self-pay

## 2023-06-14 MED ORDER — VITAMIN D (ERGOCALCIFEROL) 1.25 MG (50000 UNIT) PO CAPS
50000.0000 [IU] | ORAL_CAPSULE | ORAL | 1 refills | Status: DC
  Filled 2023-06-14: qty 12, 84d supply, fill #0
  Filled 2024-02-08: qty 12, 84d supply, fill #1

## 2023-06-15 ENCOUNTER — Other Ambulatory Visit (HOSPITAL_COMMUNITY): Payer: Self-pay

## 2023-06-15 MED ORDER — PREDNISONE 10 MG PO TABS
ORAL_TABLET | ORAL | 0 refills | Status: AC
  Filled 2023-06-15: qty 20, 8d supply, fill #0

## 2023-06-15 MED ORDER — AZITHROMYCIN 250 MG PO TABS
ORAL_TABLET | ORAL | 0 refills | Status: DC
  Filled 2023-06-15: qty 6, 5d supply, fill #0

## 2023-06-15 NOTE — Telephone Encounter (Signed)
I sent a Zpak and a prednisone taper. If you think you need something more, please let me know. Have a great trip!

## 2023-06-16 ENCOUNTER — Other Ambulatory Visit (HOSPITAL_COMMUNITY): Payer: Self-pay

## 2023-06-17 ENCOUNTER — Ambulatory Visit (HOSPITAL_COMMUNITY): Payer: Commercial Managed Care - PPO | Attending: Cardiology

## 2023-06-17 DIAGNOSIS — I361 Nonrheumatic tricuspid (valve) insufficiency: Secondary | ICD-10-CM | POA: Diagnosis not present

## 2023-06-17 DIAGNOSIS — I77819 Aortic ectasia, unspecified site: Secondary | ICD-10-CM | POA: Diagnosis not present

## 2023-06-17 LAB — ECHOCARDIOGRAM COMPLETE
Area-P 1/2: 3.92 cm2
P 1/2 time: 483 ms
S' Lateral: 2.6 cm

## 2023-06-22 ENCOUNTER — Other Ambulatory Visit (HOSPITAL_COMMUNITY): Payer: Self-pay

## 2023-06-25 ENCOUNTER — Other Ambulatory Visit (HOSPITAL_BASED_OUTPATIENT_CLINIC_OR_DEPARTMENT_OTHER): Payer: Self-pay

## 2023-06-25 DIAGNOSIS — I77819 Aortic ectasia, unspecified site: Secondary | ICD-10-CM

## 2023-07-06 ENCOUNTER — Ambulatory Visit (HOSPITAL_COMMUNITY)
Admission: RE | Admit: 2023-07-06 | Discharge: 2023-07-06 | Disposition: A | Discharge status: Home / Self Care | Payer: Commercial Managed Care - PPO | Source: Ambulatory Visit | Attending: Physician Assistant | Admitting: Physician Assistant

## 2023-07-06 ENCOUNTER — Other Ambulatory Visit (HOSPITAL_COMMUNITY): Payer: Self-pay

## 2023-07-06 ENCOUNTER — Telehealth: Payer: Self-pay

## 2023-07-06 ENCOUNTER — Other Ambulatory Visit: Payer: Self-pay

## 2023-07-06 ENCOUNTER — Encounter (HOSPITAL_COMMUNITY): Payer: Self-pay | Admitting: Physician Assistant

## 2023-07-06 VITALS — BP 116/84 | HR 98 | Ht 66.0 in | Wt 163.0 lb

## 2023-07-06 DIAGNOSIS — I10 Essential (primary) hypertension: Secondary | ICD-10-CM | POA: Diagnosis not present

## 2023-07-06 DIAGNOSIS — I4819 Other persistent atrial fibrillation: Secondary | ICD-10-CM | POA: Diagnosis not present

## 2023-07-06 DIAGNOSIS — Z7901 Long term (current) use of anticoagulants: Secondary | ICD-10-CM | POA: Insufficient documentation

## 2023-07-06 DIAGNOSIS — Z79899 Other long term (current) drug therapy: Secondary | ICD-10-CM | POA: Insufficient documentation

## 2023-07-06 MED ORDER — METOPROLOL SUCCINATE ER 25 MG PO TB24
12.5000 mg | ORAL_TABLET | Freq: Every day | ORAL | 3 refills | Status: DC
  Filled 2023-07-06: qty 45, 90d supply, fill #0

## 2023-07-06 MED ORDER — DILTIAZEM HCL 30 MG PO TABS
ORAL_TABLET | ORAL | 2 refills | Status: DC
  Filled 2023-07-06: qty 60, fill #0
  Filled 2023-07-06 (×2): qty 60, 10d supply, fill #0

## 2023-07-06 MED ORDER — FLECAINIDE ACETATE 50 MG PO TABS
50.0000 mg | ORAL_TABLET | Freq: Two times a day (BID) | ORAL | 3 refills | Status: DC
  Filled 2023-07-06: qty 60, 30d supply, fill #0

## 2023-07-06 NOTE — Telephone Encounter (Signed)
Per Dr. Lalla Brothers patient can start Toprol XL 12.5 mg twice daily. She will need an appointment with AFIB clinic to discuss cardioversion and flecainide. Continue with diltiazem for breakthrough Afib.

## 2023-07-06 NOTE — Telephone Encounter (Signed)
Spoke with pt who complains of current Afib episode x 36 hours.  She has just returned from a trip to [place] where she developed URI symptoms.  She was started on a Z-pack and Prednisone taper for 5 days, OTC Mucinex and an Albuterol Inhaler.  She is now on her 3rd day of treatment.  Her HR varies from 94-125 at rest.  She reports she is taking her Bystolic and Diltiazem but remains in Afib.  She denies current CP other than soreness from cough.  No SOB or dizziness.  She states she feels very uncomfortable when HR is above 100 and would like further help with rate control if possible. Pt advised will forward to Dr Lalla Brothers for review and recommendation.  Pt verbalizes understanding.

## 2023-07-06 NOTE — Patient Instructions (Signed)
Start Flecainide 50mg  twice a day    If convert to normal rhythm - return to normal dosing of nebivolol

## 2023-07-06 NOTE — Addendum Note (Signed)
Encounter addended by: Shona Simpson, RN on: 07/06/2023 2:42 PM  Actions taken: Order list changed

## 2023-07-06 NOTE — Progress Notes (Signed)
Primary Care Physician: Tracey Harries, MD Primary Cardiologist: Dr Pemberton/Dr Cristal Deer (new) Primary Electrophysiologist: Dr Lalla Brothers Referring Physician: Dr Monia Pouch [name] [name] is a 62 y.o. female with a history of HTN and atrial fibrillation who presents for follow up in the [facility]. Patient is a Engineer, civil (consulting) at [employer]. The patient was initially diagnosed with atrial fibrillation in 2020 after presenting to her PCP with symptoms of palpitations. She spontaneously converted while in office. She had a stress echo with Dr Boneta Lucks which was normal. She had not had any interim afib until she woke 04/16/22 in afib, confirmed by her smart watch. Patient does admit she had more alcohol than usual the night prior at a concert. She took extra doses of BB with minimal improvement. ECG showed afib HR 106. Patient has for a CHADS2VASC score of 2. She called the clinic right after leaving stating that she had converted back to SR.  On follow up today, patient went into afib about two days ago while traveling in [place] in the setting of an URI. She has been using PRN diltiazem and increased her dose of nebivolol to help slow her heart rates. No significant bleeding issues on anticoagulation.   Today, she denies symptoms of chest pain, shortness of breath, orthopnea, PND, lower extremity edema, dizziness, presyncope, syncope, snoring, daytime somnolence, bleeding, or neurologic sequela. The patient is tolerating medications without difficulties and is otherwise without complaint today.    Atrial Fibrillation Risk Factors:  she does not have symptoms or diagnosis of sleep apnea. she does not have a history of rheumatic fever. she does have a history of alcohol use. The patient does not have a history of early familial atrial fibrillation or other arrhythmias.   Atrial Fibrillation Management history:  Previous antiarrhythmic drugs: none Previous  cardioversions: none Previous ablations: none Anticoagulation history: Xarelto   Past Medical History:  Diagnosis Date   Allergic rhinitis, cause unspecified    Allergy    SEASONAL   Anxiety    Arthritis    KNEES,HANDS   Cancer (HCC)    BASAL,SKIN   Extrinsic asthma, unspecified    Hyperlipidemia    Hypertension    IBS (irritable bowel syndrome)    Other and unspecified noninfectious gastroenteritis and colitis(558.9)    25 years ago    Current Outpatient Medications  Medication Sig Dispense Refill   albuterol (PROVENTIL) (2.5 MG/3ML) 0.083% nebulizer solution Take 2.5 mg by nebulization every 6 (six) hours as needed for wheezing or shortness of breath.     albuterol (VENTOLIN HFA) 108 (90 Base) MCG/ACT inhaler Inhale 2 puffs into the lungs every 6 hours as needed for wheezing or shortness of breath. 6.7 g 12   amLODipine (NORVASC) 2.5 MG tablet Take 1 tablet (2.5 mg total) by mouth daily. 90 tablet 1   azithromycin (ZITHROMAX) 250 MG tablet Take 2 tablets by mouth on day 1 then take 1 tablet once daily for 4 days. 6 tablet 0   cetirizine (ZYRTEC) 10 MG tablet Take 10 mg by mouth as needed.     diltiazem (CARDIZEM) 30 MG tablet Take 1 tablet (30 mg total) by mouth 2 (two) times daily as needed (for breakthrough palpitations/afib). 60 tablet 4   EPINEPHrine 0.3 mg/0.3 mL IJ SOAJ injection INJECT 0.3 MLS (0.3 MG DOSE) INTO THE MUSCLE ONCE AS NEEDED FOR ANAPHYLAXIS FOR UP TO ONE DOSE 2 each 0   estradiol (VIVELLE-DOT) 0.05 MG/24HR patch APPLY ONE PATCH TO SKIN  TWICE A WEEK 24 patch 4   fluticasone-salmeterol (ADVAIR DISKUS) 100-50 MCG/ACT AEPB INHALE ONE PUFF INTO THE LUNGS TWICE A DAY THEN RINSE MOUTH 60 each 6   metoprolol succinate (TOPROL XL) 25 MG 24 hr tablet Take 0.5 tablets (12.5 mg total) by mouth daily. 45 tablet 3   Misc Natural Products (PROGESTERONE) 1000 MG/60GM CREA Apply 1 Application topically 3 (three) times a week.  OTC     Multiple Vitamin (MULTIVITAMIN) capsule  Take 1 capsule by mouth daily.       Multiple Vitamins-Minerals (MULTIVITAMIN WITH MINERALS) tablet Take 1 tablet by mouth 3 (three) times a week.     Nebivolol HCl (BYSTOLIC) 20 MG TABS Take 1 tablet (20 mg total) by mouth daily. 90 tablet 3   NON FORMULARY [facility] Scar Cream: Verapamil 10%; Pentoxifylline 5%  Apply 1 to 2 gm to affected areas 3-4x daily prn (100 gm)  - for Plantar Fibromas in arches  Faxed over to [facility] on 08/08/19 with refills as needed     PARoxetine (PAXIL) 10 MG tablet Take 1 tablet (10 mg total) by mouth daily. 90 tablet 3   rivaroxaban (XARELTO) 20 MG TABS tablet Take 1 tablet (20 mg total) by mouth daily with supper. 30 tablet 6   spironolactone (ALDACTONE) 50 MG tablet Take 1 tablet (50 mg total) by mouth daily. (Patient taking differently: Take 25 mg by mouth daily.) 90 tablet 3   Vitamin D, Ergocalciferol, (DRISDOL) 1.25 MG (50000 UNIT) CAPS capsule Take 1 capsule by mouth once a week. 12 capsule 1   Current Facility-Administered Medications  Medication Dose Route Frequency Provider Last Rate Last Admin   diclofenac sodium (VOLTAREN) 1 % transdermal gel 2 g  2 g Topical QID Richardean Canal W, PA-C        ROS- All systems are reviewed and negative except as per the HPI above.  Physical Exam: Vitals:   07/06/23 1358  BP: 116/84  Pulse: 98  Weight: 73.9 kg  Height: 5\' 6"  (1.676 m)     GEN: Well nourished, well developed in no acute distress NECK: No JVD; No carotid bruits CARDIAC: Irregularly irregular rate and rhythm, no murmurs, rubs, gallops RESPIRATORY:  Clear to auscultation without rales, wheezing or rhonchi  ABDOMEN: Soft, non-tender, non-distended EXTREMITIES:  No edema; No deformity    Wt Readings from Last 3 Encounters:  07/06/23 73.9 kg  05/20/23 76.1 kg  03/23/23 72.6 kg    EKG today demonstrates  Afib Vent. rate 98 BPM PR interval * ms QRS duration 66 ms QT/QTcB 330/421 ms   Echo 06/17/23  1. Left  ventricular ejection fraction, by estimation, is 60 to 65%. The  left ventricle has normal function. Left ventricular endocardial border  not optimally defined to evaluate regional wall motion. Left ventricular  diastolic parameters were normal.   2. Right ventricular systolic function is normal. The right ventricular  size is mildly enlarged. There is normal pulmonary artery systolic  pressure. The estimated right ventricular systolic pressure is 34.1 mmHg.   3. Left atrial size was mildly dilated.   4. The mitral valve is normal in structure. Trivial mitral valve  regurgitation. No evidence of mitral stenosis.   5. Tricuspid valve regurgitation is mild to moderate.   6. The aortic valve is tricuspid. Aortic valve regurgitation is trivial.  No aortic stenosis is present.   7. Aortic dilatation noted. There is mild dilatation of the ascending  aorta, measuring 41 mm.   8.  The inferior vena cava is normal in size with greater than 50%  respiratory variability, suggesting right atrial pressure of 3 mmHg.    Epic records are reviewed at length today  CHA2DS2-VASc Score = 2  The patient's score is based upon: CHF History: 0 HTN History: 1 Diabetes History: 0 Stroke History: 0 Vascular Disease History: 0 Age Score: 0 Gender Score: 1       ASSESSMENT AND PLAN: Persistent Atrial Fibrillation (ICD10:  I48.19) The patient's CHA2DS2-VASc score is 2, indicating a 2.2% annual risk of stroke.   Patient remains in afib. We discussed rhythm control options today. Will start flecainide 50 mg BID. If she does not convert, will increase dose and plan for DCCV. Long term, may be a good candidate for ablation.  Continue nebivolol 20 mg BID for now, return to daily dosing once back in SR. Continue diltiazem 30 mg q 4 hours PRN for heart racing.  Apple Watch for home monitoring Continue Xarelto 20 mg daily  HTN Stable on current regimen   Follow up in the AF clinic early next week for ECG.     Jorja Loa PA-C Afib Clinic [facility] [address] [place], [place] [address] [phone] 07/06/2023 2:13 PM

## 2023-07-12 ENCOUNTER — Ambulatory Visit (HOSPITAL_COMMUNITY)
Admission: RE | Admit: 2023-07-12 | Discharge: 2023-07-12 | Disposition: A | Discharge status: Home / Self Care | Payer: Commercial Managed Care - PPO | Source: Ambulatory Visit | Attending: Physician Assistant | Admitting: Physician Assistant

## 2023-07-12 ENCOUNTER — Other Ambulatory Visit (HOSPITAL_COMMUNITY): Payer: Self-pay | Admitting: Physician Assistant

## 2023-07-12 VITALS — HR 52

## 2023-07-12 DIAGNOSIS — Z5181 Encounter for therapeutic drug level monitoring: Secondary | ICD-10-CM | POA: Diagnosis not present

## 2023-07-12 DIAGNOSIS — R001 Bradycardia, unspecified: Secondary | ICD-10-CM | POA: Insufficient documentation

## 2023-07-12 DIAGNOSIS — Z79899 Other long term (current) drug therapy: Secondary | ICD-10-CM | POA: Insufficient documentation

## 2023-07-12 DIAGNOSIS — I4819 Other persistent atrial fibrillation: Secondary | ICD-10-CM | POA: Diagnosis not present

## 2023-07-12 NOTE — Addendum Note (Signed)
Encounter addended by: Learta Codding, CMA on: 07/12/2023 4:56 PM  Actions taken: Visit diagnoses modified, Order list changed, Diagnosis association updated

## 2023-07-12 NOTE — Addendum Note (Signed)
Encounter addended by: Danice Goltz, PA on: 07/12/2023 4:10 PM  Actions taken: Clinical Note Signed

## 2023-07-12 NOTE — Progress Notes (Addendum)
Patient returns for ECG after starting flecainide. ECG shows:  SB Vent. rate 52 BPM PR interval 196 ms QRS duration 80 ms QT/QTcB 452/420 ms  Patient chemically converted with flecainide. Continue flecainide 50 mg BID. Will arrange for TST to evaluate for QRS changes. Follow up with Dr Cristal Deer as scheduled and with the AF clinic in 6 months.    Informed Consent   Shared Decision Making/Informed Consent The risks [chest pain, shortness of breath, cardiac arrhythmias, dizziness, blood pressure fluctuations, myocardial infarction, stroke/transient ischemic attack, and life-threatening complications (estimated to be 1 in 10,000)], benefits (risk stratification, diagnosing coronary artery disease, treatment guidance) and alternatives of an exercise tolerance test were discussed in detail with [name] [name] and she agrees to proceed.

## 2023-07-14 ENCOUNTER — Other Ambulatory Visit (HOSPITAL_COMMUNITY): Payer: Self-pay

## 2023-07-19 ENCOUNTER — Other Ambulatory Visit: Payer: Self-pay

## 2023-07-19 ENCOUNTER — Other Ambulatory Visit (HOSPITAL_COMMUNITY): Payer: Self-pay

## 2023-07-19 DIAGNOSIS — I480 Paroxysmal atrial fibrillation: Secondary | ICD-10-CM

## 2023-07-19 MED ORDER — RIVAROXABAN 20 MG PO TABS
20.0000 mg | ORAL_TABLET | Freq: Every day | ORAL | 3 refills | Status: DC
  Filled 2023-07-19 – 2023-08-04 (×6): qty 90, 90d supply, fill #0
  Filled 2023-10-31: qty 90, 90d supply, fill #1
  Filled 2024-02-08 – 2024-02-12 (×2): qty 90, 90d supply, fill #2
  Filled 2024-04-24 (×2): qty 90, 90d supply, fill #3

## 2023-07-21 ENCOUNTER — Ambulatory Visit: Payer: Commercial Managed Care - PPO | Attending: Physician Assistant

## 2023-07-21 DIAGNOSIS — Z79899 Other long term (current) drug therapy: Secondary | ICD-10-CM

## 2023-07-21 DIAGNOSIS — Z5181 Encounter for therapeutic drug level monitoring: Secondary | ICD-10-CM

## 2023-07-22 LAB — EXERCISE TOLERANCE TEST
Angina Index: 0
Duke Treadmill Score: 6
Estimated workload: 7
Exercise duration (min): 6 min
Exercise duration (sec): 0 s
MPHR: 157 {beats}/min
Peak HR: 121 {beats}/min
Percent HR: 77 %
RPE: 15
Rest HR: 62 {beats}/min
ST Depression (mm): 0 mm

## 2023-07-26 ENCOUNTER — Ambulatory Visit: Payer: Self-pay | Admitting: Internal Medicine

## 2023-07-28 ENCOUNTER — Telehealth: Payer: Self-pay

## 2023-07-28 ENCOUNTER — Other Ambulatory Visit (HOSPITAL_COMMUNITY): Payer: Self-pay

## 2023-07-28 MED ORDER — FLECAINIDE ACETATE 50 MG PO TABS
50.0000 mg | ORAL_TABLET | Freq: Two times a day (BID) | ORAL | 3 refills | Status: DC
  Filled 2023-07-28 – 2023-07-30 (×2): qty 180, 90d supply, fill #0
  Filled 2023-10-31: qty 180, 90d supply, fill #1
  Filled 2024-02-08: qty 180, 90d supply, fill #2
  Filled 2024-06-03: qty 180, 90d supply, fill #3

## 2023-07-28 NOTE — Telephone Encounter (Signed)
Pt requesting Flecainide Rx be changed to 90 day fill.  Flecainide 50mg  - 1 tablet by mouth bid sent to [facility] as requested.

## 2023-07-30 ENCOUNTER — Other Ambulatory Visit: Payer: Self-pay

## 2023-07-30 ENCOUNTER — Other Ambulatory Visit (HOSPITAL_COMMUNITY): Payer: Self-pay

## 2023-07-31 ENCOUNTER — Other Ambulatory Visit (HOSPITAL_COMMUNITY): Payer: Self-pay

## 2023-08-03 ENCOUNTER — Other Ambulatory Visit: Payer: Self-pay

## 2023-08-04 ENCOUNTER — Other Ambulatory Visit (HOSPITAL_COMMUNITY): Payer: Self-pay

## 2023-08-23 ENCOUNTER — Other Ambulatory Visit: Payer: Self-pay

## 2023-08-23 ENCOUNTER — Other Ambulatory Visit (HOSPITAL_COMMUNITY): Payer: Self-pay

## 2023-08-24 ENCOUNTER — Other Ambulatory Visit (HOSPITAL_COMMUNITY): Payer: Self-pay

## 2023-09-03 ENCOUNTER — Ambulatory Visit (HOSPITAL_BASED_OUTPATIENT_CLINIC_OR_DEPARTMENT_OTHER): Payer: Commercial Managed Care - PPO | Admitting: Cardiology

## 2023-09-05 NOTE — Progress Notes (Unsigned)
Subjective:    Patient ID: [name] [name], female    DOB: Nov 20, 1960, 62 y.o.   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  07/24/22-62 year old female former minimal smoker, RN for Heart Care, followed for Allergic Rhinitis, history otitis, Asthma, Covid infection [id number] complicated by HTN, PAFib, IBS, Hypertriglyceride, Osteoarthritis, -Advair 100, Ventolin hfa, neb albuterol,  Covid vax- 5 Phizer Flu vax- had -----Pt is doing well  Reviewed meds. Asks Zpak and prednisone taper to hold for trip to [place]. No new concerns. Cardiology follows for PAFib. CXR 11/12/21- IMPRESSION: No active cardiopulmonary disease.  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 Discussed the use of AI scribe software for clinical note transcription with the patient, who gave verbal consent to proceed.  History of Present Illness   The patient, a Research scientist (physical sciences), presents for a follow-up visit after a recent respiratory illness. She reports having contracted a "nasty bug" during a trip to [place] in October, which resulted in prolonged coughing for several weeks. The patient self-treated with prednisone and antibiotics, suspecting bronchitis. She did not test for COVID-19 but believes it was a respiratory virus as several other members of her travel group also fell ill but tested negative for COVID-19.  In addition to the respiratory illness, the patient mentions a history of Afib. She reports that the recent illness  triggered an Afib episode, which was managed with flecainide. The patient converted out of Afib without further intervention and continues to take flecainide. She is scheduled to see her cardiologist in January and the Afib clinic in the spring. The patient also mentions a history of bronchial cough and uses Advair and Albuterol inhalers, as well as a nebulizer machine as needed.      ROS-see HPI + = positive Constitutional:   No-   weight loss, night sweats, fevers, chills, fatigue, lassitude. HEENT:   No-  headaches, difficulty swallowing, tooth/dental problems, sore throat,       No-  sneezing, itching, ear ache, nasal congestion, post nasal drip,  CV:  No-   chest pain, orthopnea, PND, swelling in lower extremities, anasarca, dizziness, +palpitations Resp: No-   shortness of breath with exertion or at rest.          productive cough,  No non-productive cough,  No- coughing up of blood.             change in color of mucus.  No- wheezing.   Skin: No-   rash or lesions. GI:  No-   heartburn, indigestion, abdominal pain, nausea, vomiting, GU: . MS:  No-   joint pain or swelling.   Neuro-     nothing unusual Psych:  No- change in mood or affect. No depression or anxiety.  No memory loss.   Objective:   Physical Exam General- Alert, Oriented, Affect-appropriate, Distress- none acute   Skin- rash-none, lesions- none, excoriation- none Lymphadenopathy- none Head- atraumatic            Eyes- Gross vision intact, PERRLA, conjunctivae clear secretions            Ears- Hearing, canals normal  Nose- Clear, Mild- Septal dev, narrower on left,    No- mucus, polyps, erosion, perforation             Throat- Mallampati II , mucosa clear , drainage- none, tonsils- atrophic Neck- flexible , trachea midline, no stridor , thyroid nl, carotid no bruit Chest - symmetrical excursion , unlabored           Heart/CV- RRR , no murmur , no gallop  , no rub, nl s1 s2                           - JVD-  none , edema- none, stasis changes- none, varices- none           Lung- clear to P&A, wheeze- none, cough- none, dullness-none, rub- none           Chest wall- ?slight kyphosis Abd-  Br/ Gen/ Rectal- Not done, not indicated Extrem- cyanosis- none, clubbing, none, atrophy- none, strength- nl Neuro- grossly intact to observation   Assessment & Plan:   Assessment and Plan    Atrial Fibrillation Paroxysmal AFib, triggered by recent respiratory illness. Converted with Flecainide 50mg , currently well controlled. Patient is on Xarelto for stroke prevention. -Continue Flecainide 50mg  and Xarelto. -Follow up with cardiologist in January and Afib clinic in the spring.  Respiratory Illness Recent severe bronchitis following a trip to [place], required treatment with prednisone and antibiotics. Currently resolved. -Continue Advair and Albuterol inhaler as needed. -Use nebulizer machine if required.  General Health Maintenance -Continue current medications, no refills needed at this time. -Plan for potential part-time work in May.

## 2023-09-06 ENCOUNTER — Ambulatory Visit: Payer: Commercial Managed Care - PPO | Admitting: Internal Medicine

## 2023-09-06 ENCOUNTER — Encounter: Payer: Self-pay | Admitting: Internal Medicine

## 2023-09-06 VITALS — BP 100/60 | HR 57 | Ht 66.0 in | Wt 167.2 lb

## 2023-09-06 DIAGNOSIS — J4521 Mild intermittent asthma with (acute) exacerbation: Secondary | ICD-10-CM

## 2023-09-06 DIAGNOSIS — I480 Paroxysmal atrial fibrillation: Secondary | ICD-10-CM | POA: Diagnosis not present

## 2023-09-06 DIAGNOSIS — Z7901 Long term (current) use of anticoagulants: Secondary | ICD-10-CM

## 2023-09-06 NOTE — Patient Instructions (Signed)
Glad you are doing well.  We are here to help if you need.  Merry Christmas!!

## 2023-09-08 ENCOUNTER — Encounter: Payer: Self-pay | Admitting: Internal Medicine

## 2023-09-26 NOTE — Progress Notes (Signed)
 Cardiology Office Note:  .   Date:  09/27/2023  ID:  Ronal Arlean Marina, DOB May 13, 1961, MRN [id number] PCP: Pura Lenis, MD  [facility] Providers Cardiologist:  Shelda Bruckner, MD Electrophysiologist:  OLE ONEIDA HOLTS, MD {  History of Present Illness: [name] [name]   Lillyana Majette is a 63 y.o. female with paroxysmal atrial fibrillation. She was previously followed by Dr. Hobart and established care with me on 09/27/23.  Pertinent CV history: atrial fibrillation diagnosed 2020. Has paroxysmal events but very symptomatic  Today: Went to [place] in October, got URI, went into afib. Started on flecainide , converted to sinus in 2 days. Has done well since.   ARBs make her itch, as did hydrochlorothiazide. Swells on higher dose of amlodipine  but does ok on low dose. Had diarrhea on spironolactone . Tolerates nebivolol , now taking 30 mg daily since stopping spironolactone  as she was worried her BP might go up otherwise.  ROS: Denies chest pain, shortness of breath at rest or with normal exertion. No PND, orthopnea, LE edema or unexpected weight gain. No syncope or other palpitations. ROS otherwise negative except as noted.   Studies Reviewed: [name] [name]    EKG:       Physical Exam:   VS:  BP 132/64   Pulse 60   Ht 5' 6 (1.676 m)   Wt 166 lb (75.3 kg)   SpO2 97%   BMI 26.79 kg/m    Wt Readings from Last 3 Encounters:  09/27/23 166 lb (75.3 kg)  09/06/23 167 lb 3.2 oz (75.8 kg)  07/06/23 163 lb (73.9 kg)    GEN: Well nourished, well developed in no acute distress HEENT: Normal, moist mucous membranes NECK: No JVD CARDIAC: regular rhythm, normal S1 and S2, no rubs or gallops. No murmur. VASCULAR: Radial and DP pulses 2+ bilaterally. No carotid bruits RESPIRATORY:  Clear to auscultation without rales, wheezing or rhonchi  ABDOMEN: Soft, non-tender, non-distended MUSCULOSKELETAL:  Ambulates independently SKIN: Warm and dry, no edema NEUROLOGIC:  Alert and oriented x 3. No  focal neuro deficits noted. PSYCHIATRIC:  Normal affect    ASSESSMENT AND PLAN: .    Atrial fibrillation, paroxysmal -follows with Dr. Holts and afib clinic -on flecainide , ETT normal 07/21/23 for high risk medication use -tolerating rivaroxaban , no major bleeding issues, does bruise easily -CHA2DS2/VAS Stroke Risk Points= 2   Hypertension -currently taking nebivolol  30 mg daily. Will see what her BP is back on 20 mg daily, but if needed can continue 30 mg long term -continue amlodipine  2.5 mg daily  CV risk counseling and prevention -recommend heart healthy/Mediterranean diet, with whole grains, fruits, vegetable, fish, lean meats, nuts, and olive oil. Limit salt. -recommend moderate walking, 3-5 times/week for 30-50 minutes each session. Aim for at least 150 minutes.week. Goal should be pace of 3 miles/hours, or walking 1.5 miles in 30 minutes -recommend avoidance of tobacco products. Avoid excess alcohol. -ASCVD risk score: The 10-year ASCVD risk score (Arnett DK, et al., 2019) is: 6%   Values used to calculate the score:     Age: 69 years     Sex: Female     Is Non-Hispanic African American: No     Diabetic: No     Tobacco smoker: No     Systolic Blood Pressure: 132 mmHg     Is BP treated: Yes     HDL Cholesterol: 78 mg/dL     Total Cholesterol: 238 mg/dL    Dispo: October 2025 after echo or sooner as needed  Signed, Shelda Bruckner, MD   Shelda Bruckner, MD, PhD, [facility]  [facility]  [place]  [facility] & Vascular at [facility] [address], [address] [place], [place] [address] [phone]

## 2023-09-27 ENCOUNTER — Encounter (HOSPITAL_BASED_OUTPATIENT_CLINIC_OR_DEPARTMENT_OTHER): Payer: Self-pay | Admitting: Cardiology

## 2023-09-27 ENCOUNTER — Ambulatory Visit (HOSPITAL_BASED_OUTPATIENT_CLINIC_OR_DEPARTMENT_OTHER): Payer: Commercial Managed Care - PPO | Admitting: Cardiology

## 2023-09-27 VITALS — BP 132/64 | HR 60 | Ht 66.0 in | Wt 166.0 lb

## 2023-09-27 DIAGNOSIS — D6869 Other thrombophilia: Secondary | ICD-10-CM | POA: Diagnosis not present

## 2023-09-27 DIAGNOSIS — Z7901 Long term (current) use of anticoagulants: Secondary | ICD-10-CM

## 2023-09-27 DIAGNOSIS — Z7189 Other specified counseling: Secondary | ICD-10-CM | POA: Diagnosis not present

## 2023-09-27 DIAGNOSIS — Z79899 Other long term (current) drug therapy: Secondary | ICD-10-CM | POA: Diagnosis not present

## 2023-09-27 DIAGNOSIS — I480 Paroxysmal atrial fibrillation: Secondary | ICD-10-CM

## 2023-09-27 NOTE — Patient Instructions (Addendum)
 Medication Instructions:  Your physician recommends that you continue on your current medications as directed. Please refer to the Current Medication list given to you today.  Try going back to 20 mg nebivolol  daily. If your BP is up with this, then we will continue the 30 mg dose long term.  *If you need a refill on your cardiac medications before your next appointment, please call your pharmacy*   Follow-Up: At [facility], you and your health needs are our priority.  As part of our continuing mission to provide you with exceptional heart care, we have created designated Provider Care Teams.  These Care Teams include your primary Cardiologist (physician) and Advanced Practice Providers (APPs -  Physician Assistants and Nurse Practitioners) who all work together to provide you with the care you need, when you need it.  We recommend signing up for the patient portal called MyChart.  Sign up information is provided on this After Visit Summary.  MyChart is used to connect with patients for Virtual Visits (Telemedicine).  Patients are able to view lab/test results, encounter notes, upcoming appointments, etc.  Non-urgent messages can be sent to your provider as well.   To learn more about what you can do with MyChart, go to [email or web address].    Your next appointment:   October 2025  Provider:   Shelda Bruckner, MD    Other Instructions

## 2023-10-06 ENCOUNTER — Other Ambulatory Visit (HOSPITAL_COMMUNITY): Payer: Self-pay

## 2023-10-06 DIAGNOSIS — Z1231 Encounter for screening mammogram for malignant neoplasm of breast: Secondary | ICD-10-CM | POA: Diagnosis not present

## 2023-10-06 DIAGNOSIS — Z889 Allergy status to unspecified drugs, medicaments and biological substances status: Secondary | ICD-10-CM | POA: Diagnosis not present

## 2023-10-06 DIAGNOSIS — R300 Dysuria: Secondary | ICD-10-CM | POA: Diagnosis not present

## 2023-10-06 DIAGNOSIS — I10 Essential (primary) hypertension: Secondary | ICD-10-CM | POA: Diagnosis not present

## 2023-10-06 DIAGNOSIS — N951 Menopausal and female climacteric states: Secondary | ICD-10-CM | POA: Diagnosis not present

## 2023-10-06 DIAGNOSIS — L282 Other prurigo: Secondary | ICD-10-CM | POA: Diagnosis not present

## 2023-10-06 DIAGNOSIS — Z8679 Personal history of other diseases of the circulatory system: Secondary | ICD-10-CM | POA: Diagnosis not present

## 2023-10-06 DIAGNOSIS — Z01419 Encounter for gynecological examination (general) (routine) without abnormal findings: Secondary | ICD-10-CM | POA: Diagnosis not present

## 2023-10-06 DIAGNOSIS — Z90711 Acquired absence of uterus with remaining cervical stump: Secondary | ICD-10-CM | POA: Diagnosis not present

## 2023-10-06 MED ORDER — ESTRADIOL 0.05 MG/24HR TD PTTW
MEDICATED_PATCH | TRANSDERMAL | 4 refills | Status: AC
  Filled 2023-10-06: qty 24, 84d supply, fill #0
  Filled 2024-02-08: qty 24, 84d supply, fill #1
  Filled 2024-06-03: qty 24, 84d supply, fill #2
  Filled 2024-07-09: qty 24, 84d supply, fill #3

## 2023-10-06 MED ORDER — CEPHALEXIN 500 MG PO CAPS
ORAL_CAPSULE | ORAL | 0 refills | Status: DC
  Filled 2023-10-06: qty 10, 5d supply, fill #0

## 2023-10-07 ENCOUNTER — Other Ambulatory Visit (HOSPITAL_COMMUNITY): Payer: Self-pay

## 2023-10-07 DIAGNOSIS — R300 Dysuria: Secondary | ICD-10-CM | POA: Diagnosis not present

## 2023-10-09 ENCOUNTER — Other Ambulatory Visit (HOSPITAL_COMMUNITY): Payer: Self-pay

## 2023-10-09 MED ORDER — HYDROCORTISONE-IODOQUINOL 1-1 % EX CREA
1.0000 | TOPICAL_CREAM | CUTANEOUS | 4 refills | Status: AC
  Filled 2023-10-09: qty 28.4, 20d supply, fill #0
  Filled 2023-10-31: qty 28.4, 30d supply, fill #0
  Filled 2024-02-08 – 2024-02-17 (×2): qty 28.4, 30d supply, fill #1

## 2023-10-12 ENCOUNTER — Other Ambulatory Visit (HOSPITAL_COMMUNITY): Payer: Self-pay

## 2023-10-27 ENCOUNTER — Other Ambulatory Visit (HOSPITAL_COMMUNITY): Payer: Self-pay

## 2023-10-31 ENCOUNTER — Other Ambulatory Visit: Payer: Self-pay | Admitting: Cardiology

## 2023-10-31 ENCOUNTER — Other Ambulatory Visit (HOSPITAL_COMMUNITY): Payer: Self-pay

## 2023-10-31 ENCOUNTER — Other Ambulatory Visit: Payer: Self-pay | Admitting: Internal Medicine

## 2023-11-01 ENCOUNTER — Other Ambulatory Visit (HOSPITAL_COMMUNITY): Payer: Self-pay

## 2023-11-01 ENCOUNTER — Other Ambulatory Visit: Payer: Self-pay

## 2023-11-01 MED ORDER — AMLODIPINE BESYLATE 2.5 MG PO TABS
2.5000 mg | ORAL_TABLET | Freq: Every day | ORAL | 2 refills | Status: DC
  Filled 2023-11-01: qty 90, 90d supply, fill #0
  Filled 2024-02-08: qty 90, 90d supply, fill #1
  Filled 2024-04-24 (×2): qty 90, 90d supply, fill #2

## 2023-11-02 ENCOUNTER — Other Ambulatory Visit (HOSPITAL_COMMUNITY): Payer: Self-pay

## 2023-11-02 MED ORDER — ALBUTEROL SULFATE HFA 108 (90 BASE) MCG/ACT IN AERS
INHALATION_SPRAY | RESPIRATORY_TRACT | 12 refills | Status: AC
  Filled 2023-11-02: qty 6.7, 25d supply, fill #0
  Filled 2024-06-03: qty 6.7, 25d supply, fill #1
  Filled 2024-07-09: qty 6.7, 25d supply, fill #2

## 2023-12-01 ENCOUNTER — Encounter: Payer: Self-pay | Admitting: Orthopaedic Surgery

## 2023-12-01 ENCOUNTER — Telehealth: Payer: Self-pay

## 2023-12-01 NOTE — Telephone Encounter (Signed)
VOB submitted for Euflexxa, right knee  

## 2023-12-10 ENCOUNTER — Telehealth: Payer: Self-pay

## 2023-12-10 NOTE — Telephone Encounter (Signed)
 PA was denied for Euflexxa. Appeal has been submitted online through euflexxa portal. Awaiting a response.

## 2023-12-15 ENCOUNTER — Telehealth: Payer: Self-pay

## 2023-12-15 NOTE — Telephone Encounter (Signed)
 Appeal is currently under review for Euflexxa, right knee.  Appeal could take up to 30 business days. Appeal #[id number] Reference# [id number]

## 2023-12-20 ENCOUNTER — Telehealth: Payer: Self-pay

## 2023-12-20 NOTE — Telephone Encounter (Signed)
 Talked with patient concerning Appeal for denial of Euflexxa.  Patient stated that she also sent in an appeal.  Stated that I will contact her once I receive some information from her insurance.  Patient voiced that she understands.

## 2023-12-27 DIAGNOSIS — M25562 Pain in left knee: Secondary | ICD-10-CM | POA: Diagnosis not present

## 2023-12-28 ENCOUNTER — Telehealth: Payer: Self-pay | Admitting: Orthopaedic Surgery

## 2023-12-28 NOTE — Telephone Encounter (Signed)
 Talked with patient and she advised me that appeal was denied for Euflexxa, right knee.  Wanted to know about a peer to peer with [employer].  If so, the number is [phone].  Please advise.  Thank you.

## 2023-12-28 NOTE — Telephone Encounter (Signed)
 Does [name] Ryder do those? She's had these many times before,what's different now?

## 2023-12-28 NOTE — Telephone Encounter (Signed)
 Patient called and wanted to talk to you about other options because her insurance didn't approve for the GEL injection. CB#[phone]

## 2024-01-04 ENCOUNTER — Telehealth: Payer: Self-pay | Admitting: Physician Assistant

## 2024-01-04 NOTE — Telephone Encounter (Signed)
 Patient called and wants to know if there any updates. CB#[phone]

## 2024-01-04 NOTE — Telephone Encounter (Signed)
 Dr. Lucienne Ryder or Dwyane Glad would have to do the peer to peer.  I gave them all the information as to why patient needs euflexxa, and the patient has even spoke with the insurance, but it's still being denied.

## 2024-01-04 NOTE — Telephone Encounter (Signed)
 Talked with patient and advised her that I am waiting on a response from Dr. Lucienne Ryder about a peer to peer for gel injection. Patient voiced that she understands.

## 2024-01-06 ENCOUNTER — Telehealth: Payer: Self-pay | Admitting: Orthopaedic Surgery

## 2024-01-06 NOTE — Telephone Encounter (Signed)
 Patient called and wanted to know her authorization for the gel shot. CB#[phone]

## 2024-01-06 NOTE — Telephone Encounter (Signed)
 Talked with patient and advised her of Dr. Arvella Bird message.  Patient voiced she understands and has an appt.scheduled for Monday.

## 2024-01-10 ENCOUNTER — Telehealth: Payer: Self-pay | Admitting: Radiology

## 2024-01-10 ENCOUNTER — Encounter: Payer: Self-pay | Admitting: Physician Assistant

## 2024-01-10 ENCOUNTER — Other Ambulatory Visit (INDEPENDENT_AMBULATORY_CARE_PROVIDER_SITE_OTHER): Payer: Self-pay

## 2024-01-10 ENCOUNTER — Ambulatory Visit: Admitting: Physician Assistant

## 2024-01-10 DIAGNOSIS — M25562 Pain in left knee: Secondary | ICD-10-CM

## 2024-01-10 DIAGNOSIS — M1711 Unilateral primary osteoarthritis, right knee: Secondary | ICD-10-CM | POA: Diagnosis not present

## 2024-01-10 NOTE — Progress Notes (Signed)
 Office Visit Note   Patient: [name] [name]           Date of Birth: 1961/02/04           MRN: [id number] Visit Date: 01/10/2024              Requested by: Alfredia Ina, MD [address] [place],  [place] [address] PCP: Alfredia Ina, MD   Assessment & Plan: Visit Diagnoses:  1. Primary osteoarthritis of right knee   2. Acute pain of left knee     Plan:  We will try to obtain Euflexxa injections for the right knee and have her back when available.  Again she had an adverse reaction and increased severe pain in the right knee and 2021 with Orthovisc.  She has done very well with Euflexxa for years and has had no adverse effects.  In regards to the left knee we will reevaluate her left knee for any mechanical symptoms or continued pain at next office visit.  Questions were encouraged and answered at length.  Follow-Up Instructions: Return for Supplemental injection.   Orders:  Orders Placed This Encounter  Procedures   XR Knee 1-2 Views Right   XR Knee 1-2 Views Left   No orders of the defined types were placed in this encounter.     Procedures: No procedures performed   Clinical Data: No additional findings.   Subjective: Chief Complaint  Patient presents with   Right Knee - Pain    Euflexxa #2   Left Knee - Pain    HPI HPI: [name] comes in today for 2 reasons 1 acute episode of left knee pain.  She had 2 episodes of painful popping in the knee.  Also had some giving way of the knee.  Pain started approximately a week and a half ago no known injury.  She states his knee pain is getting slowly better.  Most pain to the lateral aspect of the knee.  She still having trouble going up and down stairs and has a pinching sensation in the lateral aspect of the left knee.  She has been trying a knee brace Advil and ice which will help some. She is also here to discuss any viscosupplementation injection in her right knee.  In 2021 she underwent  right knee Orthovisc injection as Euflexxa was no longer covered by her insurance she had severe pain after the Orthovisc injection and never got any relief with this.  She was then started again on Euflexxa injections which she had been doing years and is continue to have no adverse effects with the Euflexxa injections.  No new injury to the right knee.  Known osteoarthritis right knee.  No scheduled surgery on either knee in the next 6 months.  Review of Systems See HPI otherwise negative or noncontributory  Objective: Vital Signs: There were no vitals taken for this visit.  Physical Exam Constitutional:      Appearance: She is not ill-appearing or diaphoretic.  Neurological:     Mental Status: She is alert and oriented to person, place, and time.  Psychiatric:        Mood and Affect: Mood normal.     Ortho Exam Bilateral knees: No abnormal warmth erythema or effusion good range of motion of both knees.  Right knee significant patellofemoral crepitus.  Minimal patellofemoral crepitus left knee.  Tenderness along the lateral joint line of both knees.  No instability with valgus varus stressing of either  knee. Left knee anterior drawer is negative.   Specialty Comments:  No specialty comments available.  Imaging: XR Knee 1-2 Views Left Result Date: 01/10/2024 Left knee 2 views: Knee is well located.  No acute fractures or acute findings.  Mild narrowing and marginal osteophytes off the lateral margin.  Medial joint lines well-preserved.  Mild to moderate patellofemoral changes.  No bony abnormalities otherwise.  XR Knee 1-2 Views Right Result Date: 01/10/2024 Right knee 2 views: No acute fracture knee is well located.  Slight valgus deformity.  Moderate narrowing lateral joint line with marginal osteophytes.  Medial compartment overall well-preserved.  Mild to moderate patellofemoral changes.    PMFS History: Patient Active Problem List   Diagnosis Date Noted   Persistent atrial  fibrillation (HCC) 03/23/2023   Vaginitis 11/11/2022   Paroxysmal atrial fibrillation (HCC) 05/29/2022   COVID-19 virus infection 11/11/2021   Pain of ulnar side of wrist 10/27/2021   History of atrial fibrillation 05/28/2020   Pain in right knee 02/09/2020   Asthma, mild intermittent 06/21/2019   Vitamin D  insufficiency 04/11/2019   History of basal cell carcinoma (BCC) 12/06/2017   Menopausal syndrome 07/25/2017   Primary osteoarthritis of right knee 01/15/2017   Face lesion 05/11/2016   Intrinsic eczema 03/05/2016   Hypertriglyceridemia 05/14/2015   Anaphylaxis due to insect venom 08/20/2014   Annual physical exam 08/20/2014   Essential hypertension 12/06/2013   Hx of abnormal Pap smear 01/15/2012   Perimenopausal vasomotor symptoms 01/15/2012   S/P hysterectomy 01/15/2012   OTITIS MEDIA, RIGHT 04/26/2008   Seasonal allergic rhinitis 04/26/2008   Asthmatic bronchitis with acute exacerbation 04/26/2008   COLITIS 04/26/2008   Past Medical History:  Diagnosis Date   Allergic rhinitis, cause unspecified    Allergy    SEASONAL   Anxiety    Arthritis    KNEES,HANDS   Cancer (HCC)    BASAL,SKIN   Extrinsic asthma, unspecified    Hyperlipidemia    Hypertension    IBS (irritable bowel syndrome)    Other and unspecified noninfectious gastroenteritis and colitis(558.9)    25 years ago    Family History  Problem Relation Age of Onset   ALS Mother    Coronary artery disease Father    Hypertension Father    Hypertension Sister    Colon cancer Maternal Uncle 60   Hypertension Other        sibling   Bipolar disorder Other        sibling   Colon polyps Neg Hx    Crohn's disease Neg Hx    Esophageal cancer Neg Hx    Rectal cancer Neg Hx    Stomach cancer Neg Hx    Ulcerative colitis Neg Hx     Past Surgical History:  Procedure Laterality Date   BREAST REDUCTION SURGERY     BUNIONECTOMY  2004   Bil   CARDIOVERSION N/A 03/23/2023   Procedure: CARDIOVERSION;  Surgeon:  Jacqueline Matsu, MD;  Location: [facility];  Service: Cardiovascular;  Laterality: N/A;   COLONOSCOPY     KNEE ARTHROSCOPY     right knee   rhinoplasty     TOTAL ABDOMINAL HYSTERECTOMY  2004   WISDOM TOOTH EXTRACTION     Social History   Occupational History   Not on file  Tobacco Use   Smoking status: Former    Current packs/day: 0.00    Average packs/day: 0.2 packs/day for 10.0 years (2.0 ttl pk-yrs)    Types: Cigarettes  Start date: 09/14/1981    Quit date: 09/15/1991    Years since quitting: 32.3   Smokeless tobacco: Never   Tobacco comments:    Former smoker 04/17/22  Vaping Use   Vaping status: Never Used  Substance and Sexual Activity   Alcohol use: Yes    Comment: 2-3 beers 2-3 days a week 04/17/22   Drug use: No   Sexual activity: Not Currently    Partners: Male    Birth control/protection: None

## 2024-01-10 NOTE — Telephone Encounter (Signed)
 Re-submit for bilateral knee gel injections  Prefers Euflexxa - see last office note with Morna Arabian xrays taken 01/10/24

## 2024-01-11 NOTE — Telephone Encounter (Signed)
 Resubmitted for Euflexxa, bilateral knee. Updated office note added through Euflexxa portal for authorization.

## 2024-01-18 ENCOUNTER — Telehealth: Payer: Self-pay | Admitting: Physician Assistant

## 2024-01-18 NOTE — Telephone Encounter (Signed)
 Pt called requesting a letter for her to be out of work a couple days due to her pains. Pt had injection but injection has not worked yet. Please send letter to pt mychart. Pt phone number is [phone].

## 2024-01-19 ENCOUNTER — Telehealth: Payer: Self-pay | Admitting: Radiology

## 2024-01-19 ENCOUNTER — Other Ambulatory Visit (HOSPITAL_COMMUNITY): Payer: Self-pay

## 2024-01-19 ENCOUNTER — Encounter: Payer: Self-pay | Admitting: Radiology

## 2024-01-19 NOTE — Telephone Encounter (Signed)
 DONE

## 2024-01-19 NOTE — Telephone Encounter (Signed)
 Has there been any updates on patient Euflexxa injections?

## 2024-01-19 NOTE — Telephone Encounter (Signed)
 Patient took today through Monday off to rest knee.

## 2024-01-19 NOTE — Telephone Encounter (Signed)
 It is still pending approval for authorization since I sent in her last office note.

## 2024-01-20 ENCOUNTER — Other Ambulatory Visit (HOSPITAL_COMMUNITY): Payer: Self-pay

## 2024-01-31 ENCOUNTER — Encounter: Payer: Self-pay | Admitting: Orthopaedic Surgery

## 2024-01-31 ENCOUNTER — Telehealth: Payer: Self-pay | Admitting: Orthopaedic Surgery

## 2024-01-31 ENCOUNTER — Telehealth: Payer: Self-pay | Admitting: Physician Assistant

## 2024-01-31 NOTE — Telephone Encounter (Signed)
 Patient called and said she wants to know if she was approved for the shot. CB#[phone]

## 2024-01-31 NOTE — Telephone Encounter (Signed)
 Talked with patient through [employer].

## 2024-01-31 NOTE — Telephone Encounter (Signed)
 Pt submitted medical release form, [employer] forms faxed to Tammy, and pt paid $20.00 cash payment. Accepted 01/31/24

## 2024-01-31 NOTE — Telephone Encounter (Signed)
 Yes

## 2024-02-03 ENCOUNTER — Telehealth: Payer: Self-pay

## 2024-02-03 NOTE — Telephone Encounter (Signed)
 Talked with [name] Powellclinical rep.with [name] [name] and was advised that request for Euflexxa is now considered a complaint and this would be a 2nd level appeal and it would take at least 60 days for a decision to be made. Case ID: [id number] Call reference# [id number]   Faxed letter and office notes to start 2nd  level appeal to [phone]  Talked with patient and advised her of message above.

## 2024-02-05 IMAGING — DX DG CHEST 2V
2 series · 2 of 2 positions shown · non-contrast
Comparison: Chest radiograph January 17, 2017.

CLINICAL DATA: History of [id number].

EXAM:
CHEST - 2 VIEW

[chest pa]
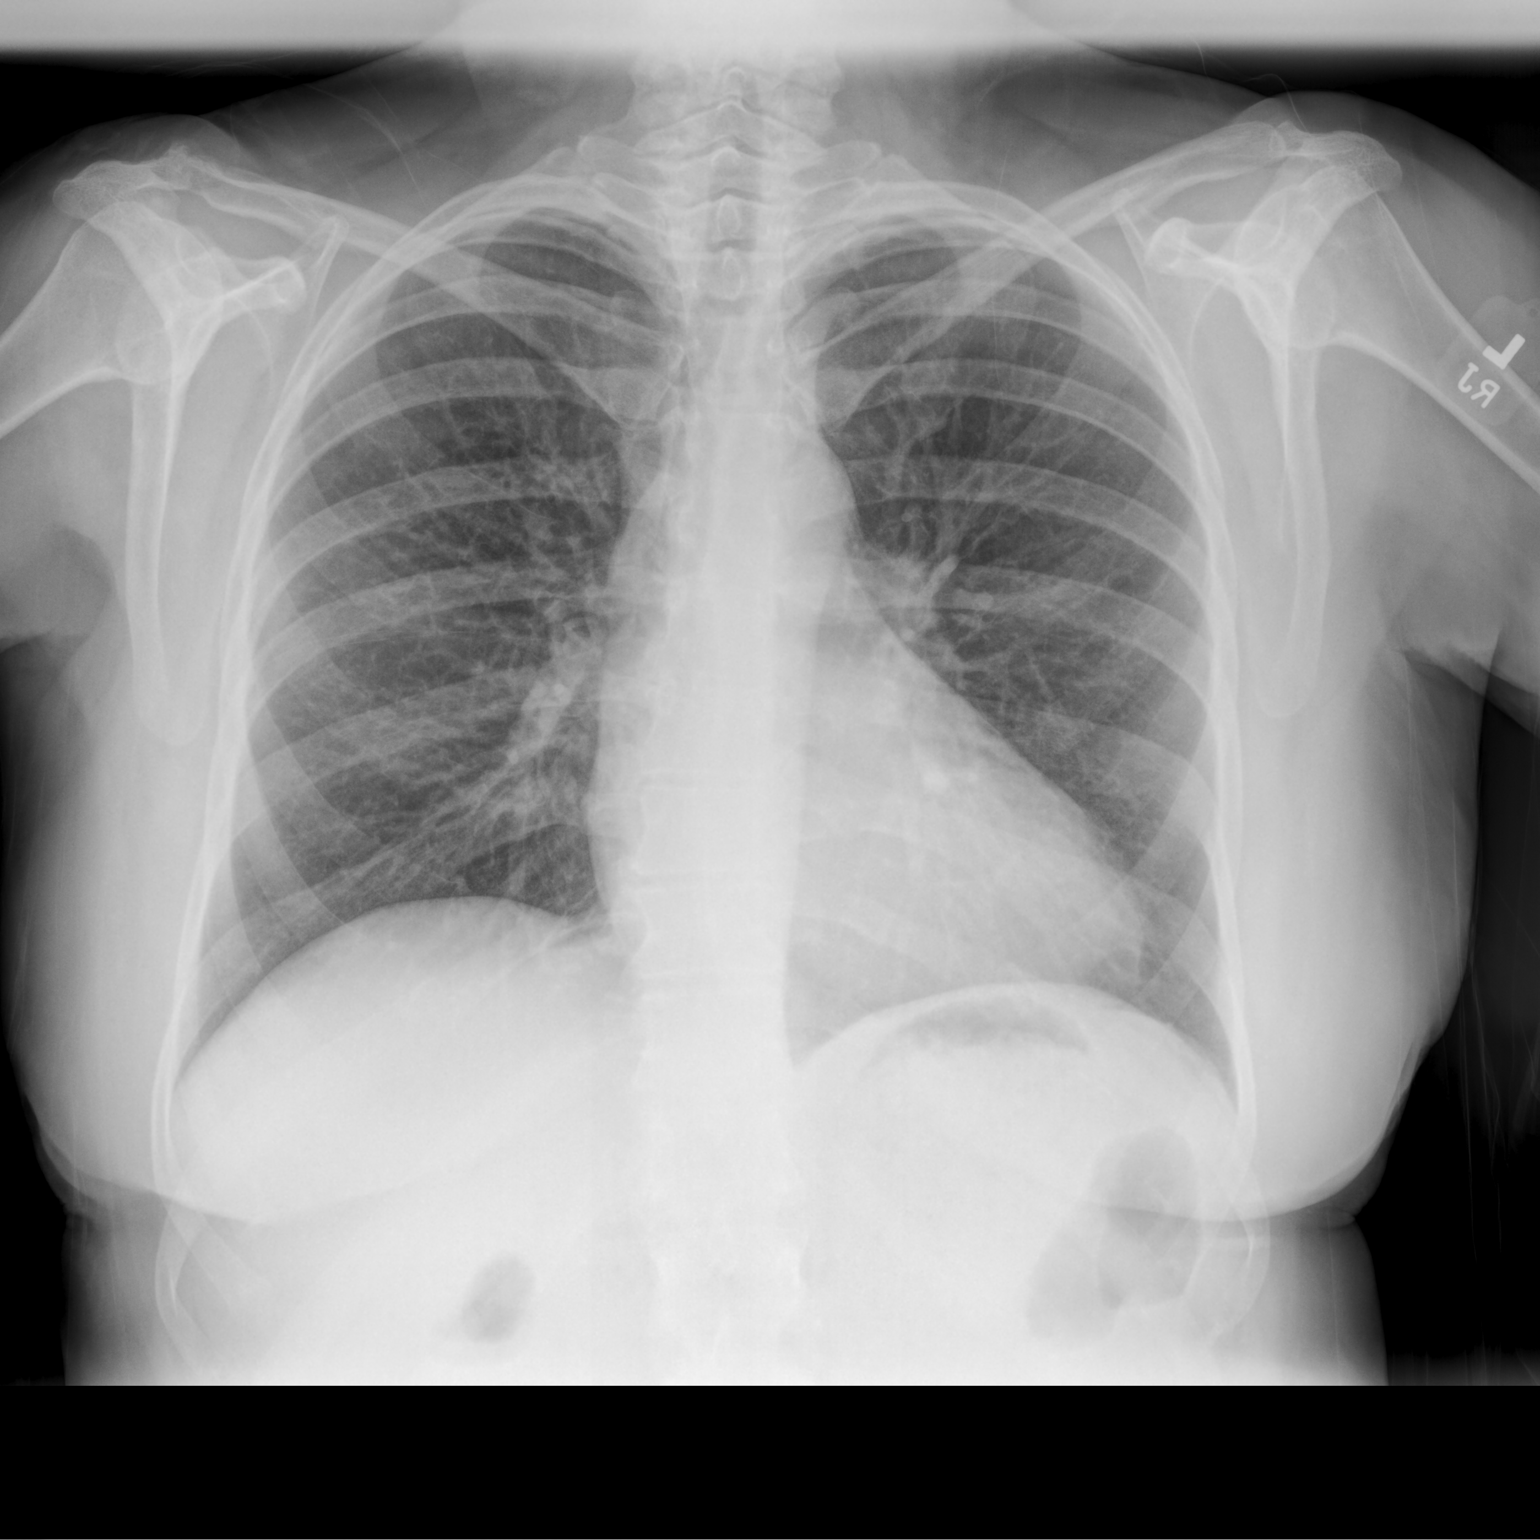

[chest lat]
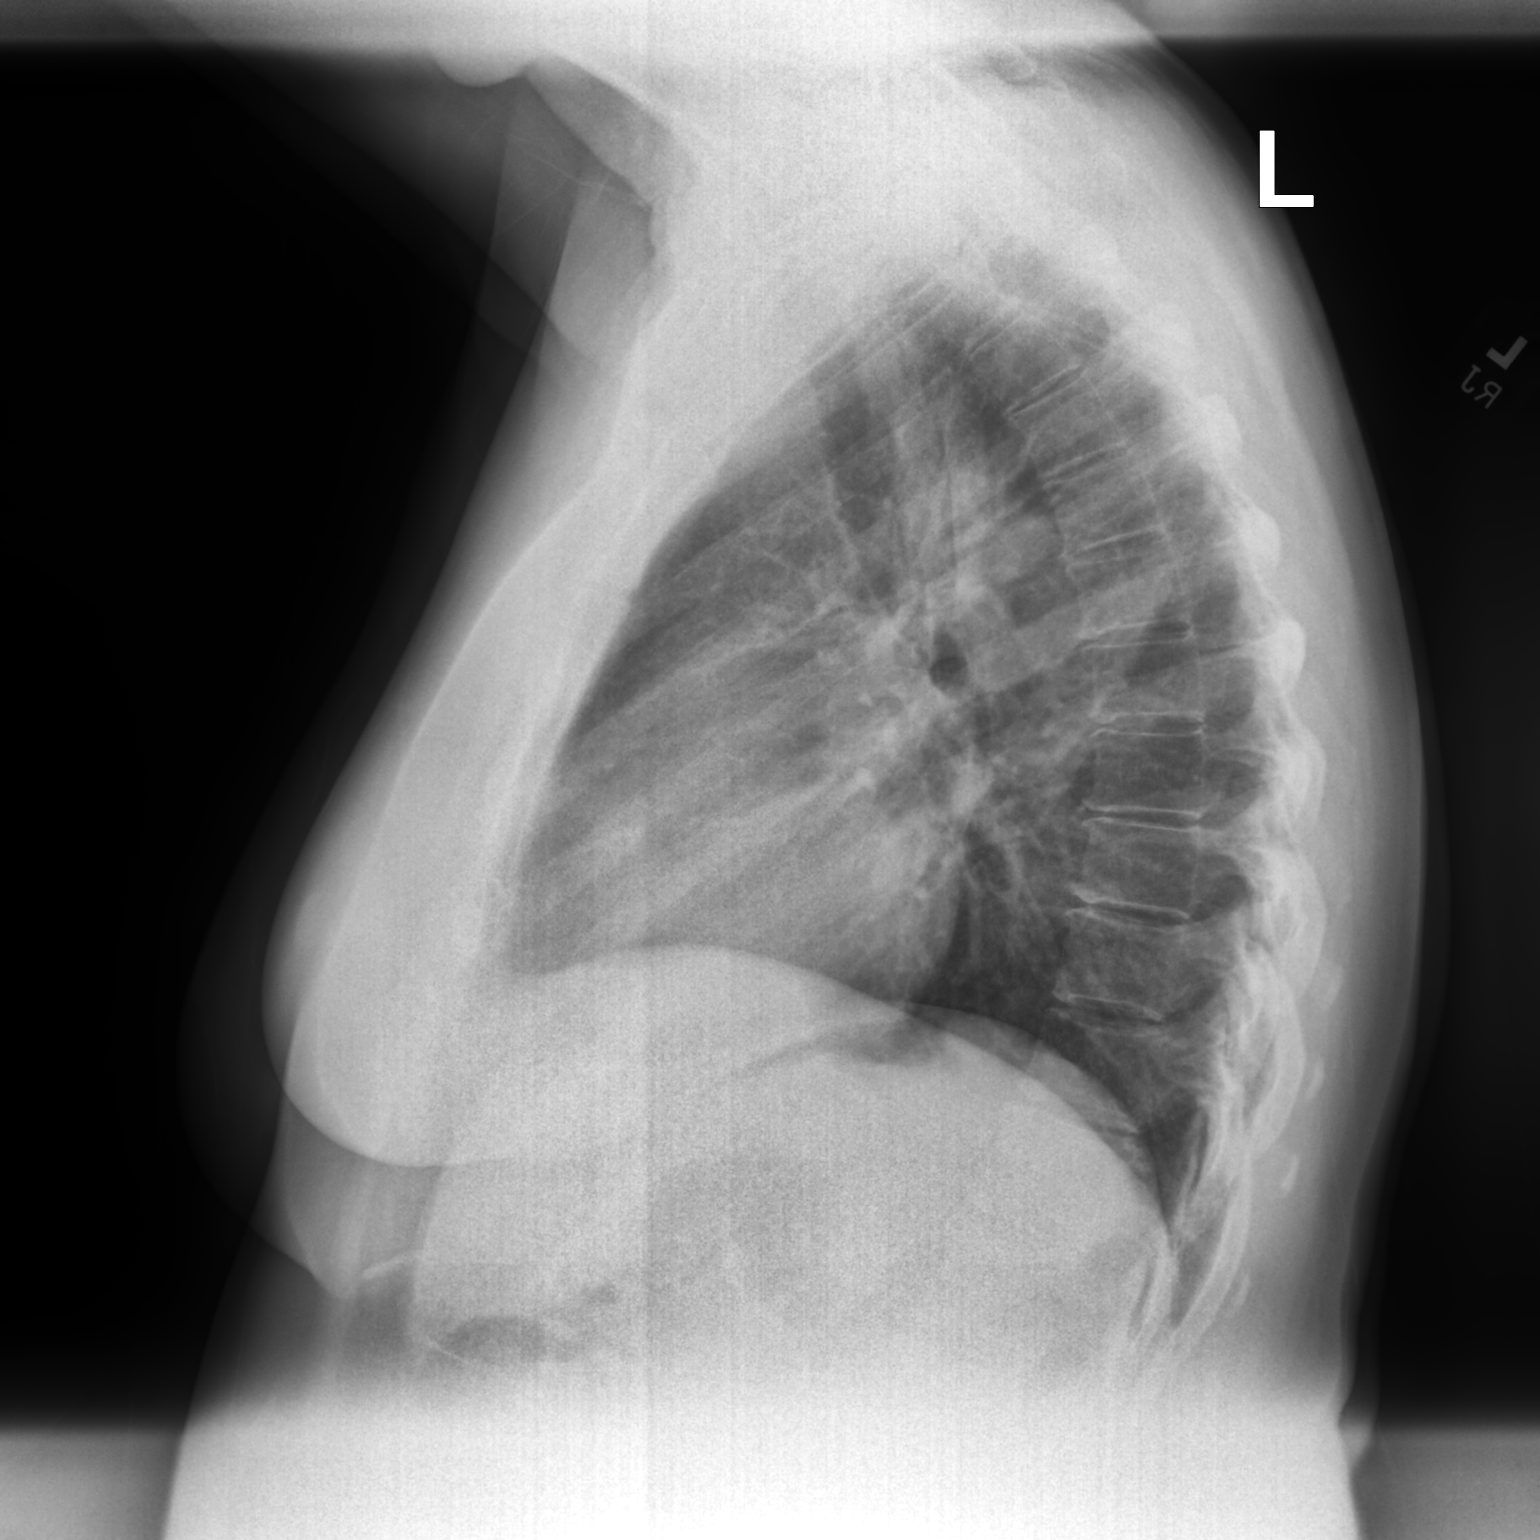

[2 of 2 positions shown; findings below may reference images not displayed]

FINDINGS: The heart size and mediastinal contours are within normal limits.
Both lungs are clear. The visualized skeletal structures are
unremarkable.
IMPRESSION: No active cardiopulmonary disease.

## 2024-02-08 ENCOUNTER — Telehealth: Payer: Self-pay

## 2024-02-08 NOTE — Telephone Encounter (Signed)
 PA was denied through [employer] for Euflexxa, bilateral knee. Still awaiting a decision through [employer].  See previous message in chart.

## 2024-02-09 ENCOUNTER — Other Ambulatory Visit (HOSPITAL_COMMUNITY): Payer: Self-pay

## 2024-02-09 ENCOUNTER — Other Ambulatory Visit: Payer: Self-pay

## 2024-02-10 ENCOUNTER — Other Ambulatory Visit: Payer: Self-pay

## 2024-02-11 ENCOUNTER — Other Ambulatory Visit (HOSPITAL_COMMUNITY): Payer: Self-pay

## 2024-02-12 ENCOUNTER — Other Ambulatory Visit (HOSPITAL_COMMUNITY): Payer: Self-pay

## 2024-02-14 ENCOUNTER — Other Ambulatory Visit (HOSPITAL_COMMUNITY): Payer: Self-pay

## 2024-02-17 ENCOUNTER — Other Ambulatory Visit (HOSPITAL_COMMUNITY): Payer: Self-pay

## 2024-02-21 ENCOUNTER — Encounter: Payer: Self-pay | Admitting: Physician Assistant

## 2024-02-21 ENCOUNTER — Ambulatory Visit: Admitting: Physician Assistant

## 2024-02-21 DIAGNOSIS — M1711 Unilateral primary osteoarthritis, right knee: Secondary | ICD-10-CM

## 2024-02-21 MED ORDER — SODIUM HYALURONATE (VISCOSUP) 20 MG/2ML IX SOSY
20.0000 mg | PREFILLED_SYRINGE | INTRA_ARTICULAR | Status: AC | PRN
  Administered 2024-02-21: 20 mg via INTRA_ARTICULAR

## 2024-02-21 NOTE — Progress Notes (Signed)
                 Procedure Note  Patient: [name] [name]             Date of Birth: December 10, 1960           MRN: [id number]             Visit Date: 02/21/2024 HPI: [name] [name] comes in today for scheduled Euflexxa injection right knee.  She is someone who has had Euflexxa injections in the past.  She has also failed conservative treatment with other viscosupplementation injections.  She has failed conservative treatment which is included exercise cortisone injections.  She is unable to take NSAIDs due to the fact she is on Xarelto  secondary to A-fib.  She has had no known injury to the right knee. Continues to have significant left knee pain.  No mechanical symptoms.  Most pain is lateral aspect of the knee.  Worse with going downstairs.  Physical exam: Right knee full extension full flexion.  No abnormal warmth erythema or effusion. Procedures: Visit Diagnoses:  1. Primary osteoarthritis of right knee     Large Joint Inj: R knee on 02/21/2024 3:59 PM Indications: pain Details: 22 G 1.5 in needle, anterolateral approach  Arthrogram: No  Medications: 20 mg Sodium Hyaluronate (Viscosup) 20 MG/2ML Outcome: tolerated well, no immediate complications Procedure, treatment alternatives, risks and benefits explained, specific risks discussed. Consent was given by the patient. Immediately prior to procedure a time out was called to verify the correct patient, procedure, equipment, support staff and site/side marked as required. Patient was prepped and draped in the usual sterile fashion.    Plan: Will have her back for second Euflexxa injection right knee in 1 week.  She may benefit from cortisone injection in the left knee at that time if she continues to have pain.  Questions were encouraged and answered at length

## 2024-02-28 ENCOUNTER — Encounter: Payer: Self-pay | Admitting: Orthopaedic Surgery

## 2024-02-28 ENCOUNTER — Ambulatory Visit: Admitting: Orthopaedic Surgery

## 2024-02-28 DIAGNOSIS — M1711 Unilateral primary osteoarthritis, right knee: Secondary | ICD-10-CM

## 2024-02-28 MED ORDER — SODIUM HYALURONATE (VISCOSUP) 20 MG/2ML IX SOSY
20.0000 mg | PREFILLED_SYRINGE | INTRA_ARTICULAR | Status: AC | PRN
  Administered 2024-02-28: 20 mg via INTRA_ARTICULAR

## 2024-02-28 NOTE — Progress Notes (Signed)
   Procedure Note  Patient: [name] [name]             Date of Birth: May 15, 1961           MRN: [id number]             Visit Date: 02/28/2024  Procedures: Visit Diagnoses:  1. Primary osteoarthritis of right knee     Large Joint Inj: R knee on 02/28/2024 1:25 PM Indications: diagnostic evaluation and pain Details: 22 G 1.5 in needle, superolateral approach  Arthrogram: No  Medications: 20 mg Sodium Hyaluronate (Viscosup) 20 MG/2ML Outcome: tolerated well, no immediate complications Procedure, treatment alternatives, risks and benefits explained, specific risks discussed. Consent was given by the patient. Immediately prior to procedure a time out was called to verify the correct patient, procedure, equipment, support staff and site/side marked as required. Patient was prepped and draped in the usual sterile fashion.    The patient is well-known to us .  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  She comes in today for injection #2 of a series of 3 hyaluronic acid injections in her right knee to treat the pain from osteoarthritis.  This is with Euflexxa.  She had no adverse reactions to the first injection.  There is just a mild effusion with the right knee and some bruising.  She is on Xarelto .  I did place injection #2 of a series of 3 in her right knee without difficulty.  She will return next week for injection #3 on her right knee.  Lot number: [id number] Expiration: 08/29/2024

## 2024-03-06 ENCOUNTER — Ambulatory Visit: Admitting: Physician Assistant

## 2024-03-06 DIAGNOSIS — M1711 Unilateral primary osteoarthritis, right knee: Secondary | ICD-10-CM

## 2024-03-06 DIAGNOSIS — M25562 Pain in left knee: Secondary | ICD-10-CM | POA: Diagnosis not present

## 2024-03-06 MED ORDER — SODIUM HYALURONATE (VISCOSUP) 20 MG/2ML IX SOSY
20.0000 mg | PREFILLED_SYRINGE | INTRA_ARTICULAR | Status: AC | PRN
  Administered 2024-03-06: 20 mg via INTRA_ARTICULAR

## 2024-03-06 MED ORDER — METHYLPREDNISOLONE ACETATE 40 MG/ML IJ SUSP
40.0000 mg | INTRAMUSCULAR | Status: AC | PRN
  Administered 2024-03-06: 40 mg via INTRA_ARTICULAR

## 2024-03-06 MED ORDER — LIDOCAINE HCL 1 % IJ SOLN
3.0000 mL | INTRAMUSCULAR | Status: AC | PRN
  Administered 2024-03-06: 3 mL

## 2024-03-06 NOTE — Progress Notes (Signed)
                                                                                                                                                                                                                                                                                                                                                                                                                                                                                                                                                                                         +++++++++++++++++++++++++++++++++++++++++++++++++++++++                                                                                                                                                                                                                                                                                                                                                                                                                                                                                                                              Procedure Note  Patient: [name] [name]             Date of Birth: 1961-07-02           MRN: [id number]             Visit Date: 03/06/2024 HPI: Mrs. Horsley returns today for her third Euflexxa injection right knee.  She is also asking for a cortisone injection in her left knee.  She states the knee is not no longer have any mechanical symptoms but she still having some discomfort in the left knee.  Physical exam: Bilateral knees good range of motion.  Patellofemoral crepitus bilaterally.  No abnormal warmth erythema or effusion of either knee. Procedures: Visit Diagnoses:  1. Primary osteoarthritis of right knee   2. Acute pain of left knee     Large Joint Inj: bilateral knee on 03/06/2024 4:18 PM Indications: pain Details: 22 G 1.5 in needle, anterolateral approach  Arthrogram: No  Medications (Right): 20 mg Sodium Hyaluronate (Viscosup) 20 MG/2ML Medications (Left): 3 mL lidocaine  1 %; 40 mg methylPREDNISolone  acetate 40 MG/ML Outcome: tolerated well, no immediate complications Procedure, treatment alternatives, risks and benefits explained, specific risks discussed. Consent was given by the patient. Immediately prior to procedure a time out was called to verify the correct patient, procedure, equipment, support staff and site/side marked as required. Patient was prepped and draped in the usual sterile fashion.     Plan: She knows to wait 6 months between supplemental injections and 3 months between cortisone injections.  Questions encouraged and answered at length.

## 2024-03-13 ENCOUNTER — Other Ambulatory Visit: Payer: Self-pay | Admitting: Cardiology

## 2024-03-14 ENCOUNTER — Other Ambulatory Visit (HOSPITAL_COMMUNITY): Payer: Self-pay

## 2024-03-14 MED ORDER — NEBIVOLOL HCL 20 MG PO TABS
20.0000 mg | ORAL_TABLET | Freq: Every day | ORAL | 1 refills | Status: DC
  Filled 2024-03-14: qty 90, 90d supply, fill #0
  Filled 2024-06-03: qty 90, 90d supply, fill #1

## 2024-04-24 ENCOUNTER — Other Ambulatory Visit (HOSPITAL_COMMUNITY): Payer: Self-pay

## 2024-06-01 ENCOUNTER — Other Ambulatory Visit (HOSPITAL_COMMUNITY): Payer: Self-pay

## 2024-06-01 ENCOUNTER — Telehealth: Payer: Self-pay | Admitting: Cardiology

## 2024-06-01 DIAGNOSIS — I480 Paroxysmal atrial fibrillation: Secondary | ICD-10-CM

## 2024-06-01 MED ORDER — EPINEPHRINE 0.3 MG/0.3ML IJ SOAJ
0.3000 mg | INTRAMUSCULAR | 0 refills | Status: AC | PRN
  Filled 2024-06-01: qty 2, 30d supply, fill #0

## 2024-06-01 MED ORDER — CYCLOSPORINE 0.05 % OP EMUL
1.0000 [drp] | Freq: Two times a day (BID) | OPHTHALMIC | 1 refills | Status: DC
  Filled 2024-06-01: qty 5.5, 30d supply, fill #0

## 2024-06-01 NOTE — Telephone Encounter (Signed)
 Called pt and LMOM.   Calling to see if pt had had Bmet/Cmet in that past year if not will need to schedule to get blood work.

## 2024-06-01 NOTE — Telephone Encounter (Signed)
 Prescription refill request for Xarelto  received.  Indication: afib  Last office visit: Lonni 09/27/2023 Weight: 75.3 kg  Age: 63 yo  Scr: 0.67, 03/16/2023 CrCl: 101 ml/min   Pt is overdue for blood work.

## 2024-06-02 ENCOUNTER — Other Ambulatory Visit (HOSPITAL_COMMUNITY): Payer: Self-pay

## 2024-06-02 MED ORDER — RIVAROXABAN 20 MG PO TABS
20.0000 mg | ORAL_TABLET | Freq: Every day | ORAL | 1 refills | Status: DC
  Filled 2024-06-02 – 2024-07-09 (×3): qty 90, 90d supply, fill #0

## 2024-06-03 ENCOUNTER — Other Ambulatory Visit: Payer: Self-pay | Admitting: Cardiology

## 2024-06-03 ENCOUNTER — Other Ambulatory Visit: Payer: Self-pay | Admitting: Internal Medicine

## 2024-06-03 ENCOUNTER — Other Ambulatory Visit (HOSPITAL_COMMUNITY): Payer: Self-pay

## 2024-06-05 ENCOUNTER — Other Ambulatory Visit: Payer: Self-pay

## 2024-06-05 ENCOUNTER — Other Ambulatory Visit (HOSPITAL_COMMUNITY): Payer: Self-pay

## 2024-06-05 MED ORDER — AMLODIPINE BESYLATE 2.5 MG PO TABS
2.5000 mg | ORAL_TABLET | Freq: Every day | ORAL | 0 refills | Status: DC
  Filled 2024-06-05 – 2024-07-09 (×2): qty 90, 90d supply, fill #0

## 2024-06-05 MED ORDER — FLUTICASONE-SALMETEROL 100-50 MCG/ACT IN AEPB
INHALATION_SPRAY | RESPIRATORY_TRACT | 2 refills | Status: AC
  Filled 2024-06-05: qty 60, 30d supply, fill #0
  Filled 2024-07-09: qty 60, 30d supply, fill #1

## 2024-06-05 MED ORDER — VITAMIN D (ERGOCALCIFEROL) 1.25 MG (50000 UNIT) PO CAPS
50000.0000 [IU] | ORAL_CAPSULE | ORAL | 1 refills | Status: AC
  Filled 2024-06-05: qty 12, 84d supply, fill #0
  Filled 2024-09-01: qty 12, 84d supply, fill #1

## 2024-06-05 NOTE — Telephone Encounter (Signed)
 Refill request for Advair  Diskus 100-50.  Per chart note from 09/06/2023:  Continue Advair  and Albuterol  inhaler as needed.  Return in about 1 year (around 09/05/2024).

## 2024-06-07 ENCOUNTER — Other Ambulatory Visit (HOSPITAL_COMMUNITY): Payer: Self-pay

## 2024-06-09 ENCOUNTER — Other Ambulatory Visit (HOSPITAL_COMMUNITY): Payer: Self-pay

## 2024-06-09 MED ORDER — CYCLOSPORINE 0.05 % OP EMUL
OPHTHALMIC | 1 refills | Status: AC
  Filled 2024-06-09: qty 60, 30d supply, fill #0
  Filled 2024-07-13: qty 60, 30d supply, fill #1

## 2024-06-16 ENCOUNTER — Other Ambulatory Visit (HOSPITAL_COMMUNITY)

## 2024-06-20 ENCOUNTER — Ambulatory Visit (HOSPITAL_COMMUNITY)
Admission: RE | Admit: 2024-06-20 | Discharge: 2024-06-20 | Disposition: A | Discharge status: Home / Self Care | Source: Ambulatory Visit | Attending: Student in an Organized Health Care Education/Training Program | Admitting: Student in an Organized Health Care Education/Training Program

## 2024-06-20 DIAGNOSIS — I10 Essential (primary) hypertension: Secondary | ICD-10-CM

## 2024-06-20 DIAGNOSIS — I77819 Aortic ectasia, unspecified site: Secondary | ICD-10-CM | POA: Diagnosis not present

## 2024-06-21 LAB — ECHOCARDIOGRAM COMPLETE
Area-P 1/2: 3.27 cm2
P 1/2 time: 523 ms
S' Lateral: 2.1 cm

## 2024-06-22 ENCOUNTER — Ambulatory Visit (HOSPITAL_BASED_OUTPATIENT_CLINIC_OR_DEPARTMENT_OTHER): Payer: Self-pay | Admitting: Family

## 2024-06-26 NOTE — Progress Notes (Unsigned)
  Electrophysiology Office Follow up Visit Note:    Date:  06/27/2024   ID:  [name] [name], DOB 1960/11/25, MRN [id number]  PCP:  Pura Lenis, MD  [facility] Cardiologist:  Shelda Bruckner, MD  [facility] Electrophysiologist:  OLE ONEIDA HOLTS, MD    Interval History:     [name] [name] is a 63 y.o. female who presents for a follow up visit.   The patient last saw Daril in the A-fib clinic October 2024.  The patient has a history of persistent atrial fibrillation.  She is on Nebivolol  and flecainide .  She takes Xarelto  for stroke prophylaxis.  She is doing well.  No sustained recurrence of arrhythmia.  Tolerating her medications well.      Past medical, surgical, social and family history were reviewed.  ROS:   Please see the history of present illness.    All other systems reviewed and are negative.  EKGs/Labs/Other Studies Reviewed:    The following studies were reviewed today:  June 21, 2024 echo EF 60% RV normal Mild MR  July 12, 2023 EKG reviewed.  PR 196 ms.  QRS duration 80 ms.       Physical Exam:    VS:  BP (!) 165/69 (BP Location: Right Arm, Patient Position: Sitting, Cuff Size: Normal)   Pulse (!) 59   Ht 5' 1 (1.549 m)   Wt 141 lb (64 kg)   SpO2 97%   BMI 26.64 kg/m     Wt Readings from Last 3 Encounters:  06/27/24 141 lb (64 kg)  09/27/23 166 lb (75.3 kg)  09/06/23 167 lb 3.2 oz (75.8 kg)     GEN: no distress CARD: RRR, No MRG RESP: No IWOB. CTAB.      ASSESSMENT:    1. Persistent atrial fibrillation (HCC)   2. Encounter for long-term (current) use of high-risk medication    PLAN:    In order of problems listed above:  #Persistent atrial fibrillation #High risk med monitoring-Flecainide  PR and QRS durations acceptable for ongoing flecainide  use today Continue Xarelto  for stroke prophylaxis  I did discuss upcoming departure from [facility].  She will follow-up with one of my EP partners moving  forward, Dr. Inocencio.  Follow-up 6 months with EP MD  Signed, OLE HOLTS, MD, [facility], [facility] 06/27/2024 3:27 PM    Electrophysiology [facility]

## 2024-06-27 ENCOUNTER — Ambulatory Visit: Attending: Cardiology | Admitting: Cardiology

## 2024-06-27 ENCOUNTER — Encounter: Payer: Self-pay | Admitting: Cardiology

## 2024-06-27 VITALS — BP 165/69 | HR 59 | Ht 61.0 in | Wt 141.0 lb

## 2024-06-27 DIAGNOSIS — Z79899 Other long term (current) drug therapy: Secondary | ICD-10-CM | POA: Diagnosis not present

## 2024-06-27 DIAGNOSIS — I4819 Other persistent atrial fibrillation: Secondary | ICD-10-CM | POA: Diagnosis not present

## 2024-06-27 NOTE — Patient Instructions (Signed)
 Medication Instructions:  Your physician recommends that you continue on your current medications as directed. Please refer to the Current Medication list given to you today.  *If you need a refill on your cardiac medications before your next appointment, please call your pharmacy*  Follow-Up: At [facility], you and your health needs are our priority.  As part of our continuing mission to provide you with exceptional heart care, our providers are all part of one team.  This team includes your primary Cardiologist (physician) and Advanced Practice Providers or APPs (Physician Assistants and Nurse Practitioners) who all work together to provide you with the care you need, when you need it.  Your next appointment:   6 months   Provider:   Soyla Norton, MD

## 2024-07-09 ENCOUNTER — Other Ambulatory Visit (HOSPITAL_COMMUNITY): Payer: Self-pay

## 2024-07-09 ENCOUNTER — Other Ambulatory Visit: Payer: Self-pay | Admitting: Cardiology

## 2024-07-10 ENCOUNTER — Other Ambulatory Visit (HOSPITAL_COMMUNITY): Payer: Self-pay

## 2024-07-10 ENCOUNTER — Encounter (HOSPITAL_BASED_OUTPATIENT_CLINIC_OR_DEPARTMENT_OTHER): Payer: Self-pay

## 2024-07-10 ENCOUNTER — Other Ambulatory Visit: Payer: Self-pay

## 2024-07-10 MED ORDER — PAROXETINE HCL 10 MG PO TABS
10.0000 mg | ORAL_TABLET | Freq: Every day | ORAL | 3 refills | Status: AC
  Filled 2024-07-10: qty 90, 90d supply, fill #0

## 2024-07-11 ENCOUNTER — Other Ambulatory Visit (HOSPITAL_COMMUNITY): Payer: Self-pay

## 2024-07-11 ENCOUNTER — Other Ambulatory Visit: Payer: Self-pay | Admitting: Medical Genetics

## 2024-07-11 DIAGNOSIS — Z006 Encounter for examination for normal comparison and control in clinical research program: Secondary | ICD-10-CM

## 2024-07-11 MED ORDER — NEBIVOLOL HCL 20 MG PO TABS
20.0000 mg | ORAL_TABLET | Freq: Every day | ORAL | 0 refills | Status: DC
  Filled 2024-07-11: qty 90, 90d supply, fill #0

## 2024-07-11 MED ORDER — FLECAINIDE ACETATE 50 MG PO TABS
50.0000 mg | ORAL_TABLET | Freq: Two times a day (BID) | ORAL | 3 refills | Status: DC
  Filled 2024-07-11: qty 180, 90d supply, fill #0
  Filled 2024-09-01: qty 60, 30d supply, fill #0

## 2024-07-12 ENCOUNTER — Encounter (HOSPITAL_BASED_OUTPATIENT_CLINIC_OR_DEPARTMENT_OTHER): Payer: Self-pay | Admitting: Cardiology

## 2024-07-12 ENCOUNTER — Ambulatory Visit (HOSPITAL_BASED_OUTPATIENT_CLINIC_OR_DEPARTMENT_OTHER): Admitting: Cardiology

## 2024-07-12 ENCOUNTER — Other Ambulatory Visit (HOSPITAL_COMMUNITY): Payer: Self-pay

## 2024-07-12 VITALS — BP 150/74 | HR 55 | Ht 61.0 in | Wt 151.3 lb

## 2024-07-12 DIAGNOSIS — D6869 Other thrombophilia: Secondary | ICD-10-CM

## 2024-07-12 DIAGNOSIS — I480 Paroxysmal atrial fibrillation: Secondary | ICD-10-CM

## 2024-07-12 DIAGNOSIS — Z7901 Long term (current) use of anticoagulants: Secondary | ICD-10-CM

## 2024-07-12 DIAGNOSIS — I10 Essential (primary) hypertension: Secondary | ICD-10-CM | POA: Diagnosis not present

## 2024-07-12 MED ORDER — NEBIVOLOL HCL 20 MG PO TABS
20.0000 mg | ORAL_TABLET | Freq: Every day | ORAL | 3 refills | Status: DC
  Filled 2024-07-12 – 2024-07-13 (×2): qty 90, 90d supply, fill #0
  Filled 2024-09-01: qty 30, 30d supply, fill #0

## 2024-07-12 MED ORDER — SPIRONOLACTONE 25 MG PO TABS
25.0000 mg | ORAL_TABLET | Freq: Every day | ORAL | 3 refills | Status: DC
  Filled 2024-07-12: qty 90, 90d supply, fill #0

## 2024-07-12 MED ORDER — AMLODIPINE BESYLATE 2.5 MG PO TABS: 2.5000 mg | ORAL_TABLET | Freq: Every day | ORAL | 3 refills | Status: DC

## 2024-07-12 MED ORDER — RIVAROXABAN 20 MG PO TABS
20.0000 mg | ORAL_TABLET | Freq: Every day | ORAL | 3 refills | Status: DC
  Filled 2024-07-12: qty 90, 90d supply, fill #0

## 2024-07-12 NOTE — Patient Instructions (Addendum)
 Restart spironolactone --start with 12.5 mg daily for about 6 days, then if tolerating increase to 25 mg daily. We will recheck BMET after 2 weeks of being on the full dose pill.  Send me a message in a few weeks with how blood pressures are doing.   Follow up with Dr. Lonni, Rosaline Bane, NP or  Reche Finder, NP in about 6 months.

## 2024-07-12 NOTE — Progress Notes (Signed)
 Cardiology Office Note:  .   Date:  07/12/2024  ID:  [name] [name], DOB 1961-01-13, MRN [id number] PCP: Pura Lenis, MD  [facility] Providers Cardiologist:  Shelda Bruckner, MD Electrophysiologist:  OLE ONEIDA HOLTS, MD {  History of Present Illness: [name]   Michaelia [name] is a 63 y.o. female with paroxysmal atrial fibrillation. She was previously followed by Dr. Hobart and established care with me on 09/27/23.  Pertinent CV history: atrial fibrillation diagnosed 2020. Has paroxysmal events but very symptomatic.  Med history: ARBs make her itch, as did hydrochlorothiazide. Swells on higher dose of amlodipine  but does ok on low dose. Had diarrhea on spironolactone . Tolerates nebivolol   Today: Yesterday had episode in the AM of afib, hasn't had in a long time (over a year). Lasted several hours, resolved after taking flecainide  and bystolic . Thinks this was triggered by stress.   Reviewed echo results, EF 60-65%, normal diastology and strain. RV function normal, RV mildly enlarged. Mild MR, trivial AR. Stable aortic dilation of 40 mm.  Saw Dr. Holts recently.   Blood pressure at home has been 140s/80s. Still having some IBS symptoms even with stopping spironolactone . Willing to retrial.   ROS: Denies chest pain, shortness of breath at rest or with normal exertion. No PND, orthopnea, severe LE edema or unexpected weight gain. No syncope. ROS otherwise negative except as noted.   Studies Reviewed: [name]    EKG:       Physical Exam:   VS:  BP (!) 150/74   Pulse (!) 55   Ht 5' 1 (1.549 m)   Wt 151 lb 4.8 oz (68.6 kg)   SpO2 99%   BMI 28.59 kg/m    Wt Readings from Last 3 Encounters:  07/12/24 151 lb 4.8 oz (68.6 kg)  06/27/24 141 lb (64 kg)  09/27/23 166 lb (75.3 kg)    GEN: Well nourished, well developed in no acute distress HEENT: Normal, moist mucous membranes NECK: No JVD CARDIAC: regular rhythm, normal S1 and S2, no rubs or gallops. No  murmur. VASCULAR: Radial and DP pulses 2+ bilaterally. No carotid bruits RESPIRATORY:  Clear to auscultation without rales, wheezing or rhonchi  ABDOMEN: Soft, non-tender, non-distended MUSCULOSKELETAL:  Ambulates independently SKIN: Warm and dry, no edema NEUROLOGIC:  Alert and oriented x 3. No focal neuro deficits noted. PSYCHIATRIC:  Normal affect    ASSESSMENT AND PLAN: .    Atrial fibrillation, paroxysmal -follows with EP and afib clinic -on flecainide , ETT normal 07/21/23 for high risk medication use -tolerating rivaroxaban , no major bleeding issues, does bruise easily -reviewed labs in Care Everywhere from last month -CHA2DS2/VAS Stroke Risk Points= 2   Hypertension -continue nebivolol  20 mg daily  -continue amlodipine  2.5 mg daily -BP elevated, will retrial spironolactone , start with low dose of 12.5 mg daily and if tolerated, move up to 25 mg dose daily. Check BMET 2 weeks after start of full dose. -she will message with blood pressure readings  CV risk counseling and prevention -recommend heart healthy/Mediterranean diet, with whole grains, fruits, vegetable, fish, lean meats, nuts, and olive oil. Limit salt. -recommend moderate walking, 3-5 times/week for 30-50 minutes each session. Aim for at least 150 minutes.week. Goal should be pace of 3 miles/hours, or walking 1.5 miles in 30 minutes -recommend avoidance of tobacco products. Avoid excess alcohol. -ASCVD risk score: The 10-year ASCVD risk score (Arnett DK, et al., 2019) is: 8.9%   Values used to calculate the score:     Age: 79  years     Clincally relevant sex: Female     Is Non-Hispanic African American: No     Diabetic: No     Tobacco smoker: No     Systolic Blood Pressure: 150 mmHg     Is BP treated: Yes     HDL Cholesterol: 80 mg/dL     Total Cholesterol: 259 mg/dL    Dispo: 6 mos or sooner as needed  Signed, Shelda Bruckner, MD   Shelda Bruckner, MD, PhD, [facility] [place]  [facility]   [place]  Heart & Vascular at [facility] at [facility] [address], [address] [place], [place] [address] [phone]

## 2024-07-13 ENCOUNTER — Other Ambulatory Visit (HOSPITAL_COMMUNITY): Payer: Self-pay

## 2024-07-13 ENCOUNTER — Other Ambulatory Visit: Payer: Self-pay

## 2024-07-17 ENCOUNTER — Encounter: Payer: Self-pay | Admitting: Radiology

## 2024-07-28 DIAGNOSIS — L299 Pruritus, unspecified: Secondary | ICD-10-CM | POA: Diagnosis not present

## 2024-07-28 DIAGNOSIS — I87393 Chronic venous hypertension (idiopathic) with other complications of bilateral lower extremity: Secondary | ICD-10-CM | POA: Diagnosis not present

## 2024-07-28 DIAGNOSIS — R600 Localized edema: Secondary | ICD-10-CM | POA: Diagnosis not present

## 2024-07-28 DIAGNOSIS — G2581 Restless legs syndrome: Secondary | ICD-10-CM | POA: Diagnosis not present

## 2024-07-28 DIAGNOSIS — I83892 Varicose veins of left lower extremities with other complications: Secondary | ICD-10-CM | POA: Diagnosis not present

## 2024-08-04 LAB — BASIC METABOLIC PANEL WITH GFR
BUN/Creatinine Ratio: 15 (ref 12–28)
BUN: 13 mg/dL (ref 8–27)
CO2: 21 mmol/L (ref 20–29)
Calcium: 9.5 mg/dL (ref 8.7–10.3)
Chloride: 101 mmol/L (ref 96–106)
Creatinine, Ser: 0.86 mg/dL (ref 0.57–1.00)
Glucose: 106 mg/dL — ABNORMAL HIGH (ref 70–99)
Potassium: 4.2 mmol/L (ref 3.5–5.2)
Sodium: 138 mmol/L (ref 134–144)
eGFR: 75 mL/min/1.73 (ref >59)

## 2024-09-01 ENCOUNTER — Other Ambulatory Visit (HOSPITAL_COMMUNITY): Payer: Self-pay

## 2024-09-01 ENCOUNTER — Encounter (HOSPITAL_BASED_OUTPATIENT_CLINIC_OR_DEPARTMENT_OTHER): Payer: Self-pay | Admitting: Cardiology

## 2024-09-01 DIAGNOSIS — I10 Essential (primary) hypertension: Secondary | ICD-10-CM

## 2024-09-01 DIAGNOSIS — I480 Paroxysmal atrial fibrillation: Secondary | ICD-10-CM

## 2024-09-11 ENCOUNTER — Telehealth: Payer: Self-pay

## 2024-09-11 NOTE — Telephone Encounter (Signed)
 Spoke with pt who complains of increasing DOE and fatigue over the past few weeks.  She states she is not currently in Afib according to her Apple watch and that Afib has been under good control on Flecainide .  Denies current CP or dizziness.  She notes strong family history of CAD and requests appointment with Dr Lonni.  Appointment scheduled for 09/18/2024 at 240pm.  Pt verbalizes understanding and thanked [employer]ITY [employer]DRAISER for the assistance.

## 2024-09-18 ENCOUNTER — Encounter (HOSPITAL_BASED_OUTPATIENT_CLINIC_OR_DEPARTMENT_OTHER): Payer: Self-pay | Admitting: Cardiology

## 2024-09-18 ENCOUNTER — Ambulatory Visit (INDEPENDENT_AMBULATORY_CARE_PROVIDER_SITE_OTHER): Payer: Self-pay | Admitting: Cardiology

## 2024-09-18 VITALS — BP 140/70 | HR 54 | Ht 61.0 in | Wt 148.1 lb

## 2024-09-18 DIAGNOSIS — Z7901 Long term (current) use of anticoagulants: Secondary | ICD-10-CM

## 2024-09-18 DIAGNOSIS — R0609 Other forms of dyspnea: Secondary | ICD-10-CM

## 2024-09-18 DIAGNOSIS — Z8249 Family history of ischemic heart disease and other diseases of the circulatory system: Secondary | ICD-10-CM

## 2024-09-18 DIAGNOSIS — D6869 Other thrombophilia: Secondary | ICD-10-CM

## 2024-09-18 DIAGNOSIS — R5383 Other fatigue: Secondary | ICD-10-CM

## 2024-09-18 DIAGNOSIS — I480 Paroxysmal atrial fibrillation: Secondary | ICD-10-CM | POA: Diagnosis not present

## 2024-09-18 NOTE — Progress Notes (Signed)
 " Cardiology Office Note:  .   Date:  09/18/2024  ID:  [name] [name], DOB 01-18-1961, MRN [id number] PCP: Pura Lenis, MD  [facility] Providers Cardiologist:  Shelda Bruckner, MD Electrophysiologist:  OLE ONEIDA HOLTS, MD {  History of Present Illness: [name]   Iwalani [name] is a 64 y.o. female with paroxysmal atrial fibrillation. She was previously followed by Dr. Hobart and established care with me on 09/27/23.  Pertinent CV history: atrial fibrillation diagnosed 2020. Has paroxysmal events but very symptomatic.  Med history: ARBs make her itch, as did hydrochlorothiazide. Swells on higher dose of amlodipine  but does ok on low dose. Had diarrhea on spironolactone . Tolerates nebivolol  Testing history: echo 06/21/24 with EF 60-65%, normal diastology, normal strain, mild MR, mild aortic dilation of 40 mm (stable since 2023). ETT 07/22/23 with 7 METs, 77% APMHR (nondiagnostic)  Today: Called office 09/11/24 with worsening dyspnea on exertion, fatigue. Does not appear that she was in afib during symptoms based on her apple watch. No chest pain, but has strong family history of CAD (dad died age 62). Notes that going up and down steps, she feels very tired and short of breath. Feels that symptoms worsened over Christmas. No afib recently. No symptoms at rest.   Has lost 20-25 lbs intentionally.   ROS: Denies chest pain, shortness of breath at rest. No PND, orthopnea, severe LE edema or unexpected weight gain. No syncope. ROS otherwise negative except as noted.   Studies Reviewed: [name]    EKG:       Physical Exam:   VS:  BP (!) 140/70   Pulse (!) 54   Ht 5' 1 (1.549 m)   Wt 148 lb 1.6 oz (67.2 kg)   SpO2 98%   BMI 27.98 kg/m    Wt Readings from Last 3 Encounters:  09/18/24 148 lb 1.6 oz (67.2 kg)  07/12/24 151 lb 4.8 oz (68.6 kg)  06/27/24 141 lb (64 kg)    GEN: Well nourished, well developed in no acute distress HEENT: Normal, moist mucous membranes NECK: No  JVD CARDIAC: regular rhythm, normal S1 and S2, no rubs or gallops. No murmur. VASCULAR: Radial and DP pulses 2+ bilaterally. No carotid bruits RESPIRATORY:  Clear to auscultation without rales, wheezing or rhonchi  ABDOMEN: Soft, non-tender, non-distended MUSCULOSKELETAL:  Ambulates independently SKIN: Warm and dry, no edema NEUROLOGIC:  Alert and oriented x 3. No focal neuro deficits noted. PSYCHIATRIC:  Normal affect    ASSESSMENT AND PLAN: .    Dyspnea on exertion Family history of CAD -attempted ETT in November 2024, but did not get to target heart rate -will get coronary CT to evaluate anatomically for CAD -has lost weight, will check lipids as well. Given family history, will check lpa -with symptoms, will also check CBC and TSH -she does not want statin, feels this was what triggered her mother's ALS. We discussed statins today. Would consider ezetimibe or PCSK9i based on lipid results and plaque burden on CT -most recent lipids in care everywhere  Atrial fibrillation, paroxysmal Secondary hypercoagulable state -follows with EP and afib clinic -on flecainide , ETT normal 07/21/23 for high risk medication use (nondiagnostic for ischemia) -tolerating rivaroxaban , no major bleeding issues, does bruise easily -CHA2DS2/VAS Stroke Risk Points= 2   Hypertension -continue nebivolol  20 mg daily  -continue amlodipine  2.5 mg daily -continue spironolactone  25 mg daily  Mild aortic dilation -stable by echo since 2023, most recent 06/2024 -will also see with CT scan  CV risk  counseling and prevention -recommend heart healthy/Mediterranean diet, with whole grains, fruits, vegetable, fish, lean meats, nuts, and olive oil. Limit salt. -recommend moderate walking, 3-5 times/week for 30-50 minutes each session. Aim for at least 150 minutes.week. Goal should be pace of 3 miles/hours, or walking 1.5 miles in 30 minutes -recommend avoidance of tobacco products. Avoid excess alcohol. -ASCVD  risk score: The 10-year ASCVD risk score (Arnett DK, et al., 2019) is: 7.8%   Values used to calculate the score:     Age: 48 years     Clinically relevant sex: Female     Is Non-Hispanic African American: No     Diabetic: No     Tobacco smoker: No     Systolic Blood Pressure: 140 mmHg     Is BP treated: Yes     HDL Cholesterol: 80 mg/dL     Total Cholesterol: 259 mg/dL    Dispo: 6 week or sooner as needed  Signed, Shelda Bruckner, MD   Shelda Bruckner, MD, PhD, [facility]  [facility]  [place]  [facility] at [facility] [address], [address] [place], [place] [address] [phone]   "

## 2024-09-18 NOTE — Patient Instructions (Addendum)
 Medication Instructions:  No changes today *If you need a refill on your cardiac medications before your next appointment, please call your pharmacy*  Lab Work: Today: cbc, tsh, bmet, lipids, Lp(a)  Testing/Procedures: Coronaryh CT Angiogram - instructions below  Follow-Up: At [facility], you and your health needs are our priority.  As part of our continuing mission to provide you with exceptional heart care, our providers are all part of one team.  This team includes your primary Cardiologist (physician) and Advanced Practice Providers or APPs (Physician Assistants and Nurse Practitioners) who all work together to provide you with the care you need, when you need it.  Your next appointment:   6 week(s)  Provider:   Shelda Bruckner, MD    Other Instructions   Your cardiac CT will be scheduled at   [facility] [address]  [place], [place] [address]  Heart and Vascular Tower at [address]-- please enter the parking lot using the [address] entrance and use the FREE valet service at the patient drop-off area. Enter the building and check-in with registration on the main floor.  Please follow these instructions carefully (unless otherwise directed):  An IV will be required for this test and Nitroglycerin will be given.   On the Night Before the Test: Be sure to Drink plenty of water. Do not consume any caffeinated/decaffeinated beverages or chocolate 12 hours prior to your test. Do not take any antihistamines 12 hours prior to your test.  On the Day of the Test: Drink plenty of water until 1 hour prior to the test. Do not eat any food 1 hour prior to test. You may take your regular medications prior to the test.  Patients who wear a continuous glucose monitor MUST remove the device prior to scanning. FEMALES- please wear underwire-free bra if available, avoid dresses & tight clothing      After the Test: Drink  plenty of water. After receiving IV contrast, you may experience a mild flushed feeling. This is normal. On occasion, you may experience a mild rash up to 24 hours after the test. This is not dangerous. If this occurs, you can take Benadryl 25 mg, Zyrtec, Claritin, or Allegra and increase your fluid intake. (Patients taking Tikosyn should avoid Benadryl, and may take Zyrtec, Claritin, or Allegra) If you experience trouble breathing, this can be serious. If it is severe call 911 IMMEDIATELY. If it is mild, please call our office.  We will call to schedule your test 2-4 weeks out understanding that some insurance companies will need an authorization prior to the service being performed.   For more information and frequently asked questions, please visit our website : [email or web address]  For non-scheduling related questions, please contact the cardiac imaging nurse navigator should you have any questions/concerns: Cardiac Imaging Nurse Navigators Direct Office Dial: [phone]   For scheduling needs, including cancellations and rescheduling, please call Brittany, [phone].

## 2024-09-21 ENCOUNTER — Other Ambulatory Visit: Payer: Self-pay

## 2024-09-21 ENCOUNTER — Emergency Department (HOSPITAL_BASED_OUTPATIENT_CLINIC_OR_DEPARTMENT_OTHER): Payer: Self-pay

## 2024-09-21 ENCOUNTER — Emergency Department (HOSPITAL_BASED_OUTPATIENT_CLINIC_OR_DEPARTMENT_OTHER)
Admission: EM | Admit: 2024-09-21 | Discharge: 2024-09-21 | Disposition: A | Discharge status: Home / Self Care | Payer: Self-pay | Attending: Emergency Medicine | Admitting: Emergency Medicine

## 2024-09-21 ENCOUNTER — Encounter (HOSPITAL_BASED_OUTPATIENT_CLINIC_OR_DEPARTMENT_OTHER): Payer: Self-pay

## 2024-09-21 DIAGNOSIS — I4891 Unspecified atrial fibrillation: Secondary | ICD-10-CM | POA: Insufficient documentation

## 2024-09-21 DIAGNOSIS — W228XXA Striking against or struck by other objects, initial encounter: Secondary | ICD-10-CM | POA: Diagnosis not present

## 2024-09-21 DIAGNOSIS — Z7901 Long term (current) use of anticoagulants: Secondary | ICD-10-CM | POA: Insufficient documentation

## 2024-09-21 DIAGNOSIS — S060X0A Concussion without loss of consciousness, initial encounter: Secondary | ICD-10-CM | POA: Diagnosis not present

## 2024-09-21 DIAGNOSIS — S0990XA Unspecified injury of head, initial encounter: Secondary | ICD-10-CM | POA: Diagnosis present

## 2024-09-21 NOTE — Discharge Instructions (Signed)
 Your CT of the head was reassuring today.  No signs of a skull fracture or brain bleed.  Please continue to take your Xarelto  as prescribed.  Go ahead and take your dose for today when you get home.  You appear to have a concussion which is a state of changed mental ability from trauma.  Refrain from any strenuous physical activities or any activities that require lots of focus for the next 48 hours. Decrease your screen time (time on phones, TV, laptop, etc) to no more than 30 minutes per day while symptoms persist and get at least 8 hours of sleep at night.   You may engage in light physical activity (such as walking) as long as it does not exacerbate your symptoms.  You may return to work/school as tolerated.   Return to the ER if: There is severe confusion or drowsiness You cannot awaken the injured person.  You have repetitive vomiting   You notice dizziness or unsteadiness which is getting worse, or inability to walk.  You lose consciousness You experience severe, persistent headaches not relieved by Tylenol or ibuprofen You cannot use arms or legs normally.  There are changes in pupil sizes. (This is the black center in the colored part of the eye)  You have changes in your vision There is clear or bloody discharge from the nose or ears.  Change in speech, vision, swallowing, or understanding.  Localized weakness, numbness, tingling, or change in bowel or bladder control. Any other new or concerning symptoms

## 2024-09-21 NOTE — ED Notes (Signed)
 Patient transported to CT

## 2024-09-21 NOTE — ED Notes (Signed)
 ED Provider at bedside.
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
## 2024-09-26 ENCOUNTER — Other Ambulatory Visit: Payer: Self-pay

## 2024-09-26 MED ORDER — AMLODIPINE BESYLATE 2.5 MG PO TABS: 2.5000 mg | ORAL_TABLET | Freq: Every day | ORAL | 3 refills | Status: AC

## 2024-09-26 MED ORDER — RIVAROXABAN 20 MG PO TABS: 20.0000 mg | ORAL_TABLET | Freq: Every day | ORAL | 3 refills | Status: AC

## 2024-09-26 MED ORDER — SPIRONOLACTONE 25 MG PO TABS: 25.0000 mg | ORAL_TABLET | Freq: Every day | ORAL | 3 refills | Status: AC

## 2024-09-26 MED ORDER — FLECAINIDE ACETATE 50 MG PO TABS: 50.0000 mg | ORAL_TABLET | Freq: Two times a day (BID) | ORAL | 2 refills | Status: AC

## 2024-09-26 MED ORDER — NEBIVOLOL HCL 20 MG PO TABS: 20.0000 mg | ORAL_TABLET | Freq: Every day | ORAL | 3 refills | Status: AC

## 2024-10-03 LAB — CBC
Hematocrit: 39.7 % (ref 34.0–46.6)
Hemoglobin: 13.2 g/dL (ref 11.1–15.9)
MCH: 32.5 pg (ref 26.6–33.0)
MCHC: 33.2 g/dL (ref 31.5–35.7)
MCV: 98 fL — ABNORMAL HIGH (ref 79–97)
Platelets: 224 x10E3/uL (ref 150–450)
RBC: 4.06 x10E6/uL (ref 3.77–5.28)
RDW: 13.2 % (ref 11.7–15.4)
WBC: 5.6 x10E3/uL (ref 3.4–10.8)

## 2024-10-03 LAB — LIPID PANEL
Chol/HDL Ratio: 2.2 ratio (ref 0.0–4.4)
Cholesterol, Total: 283 mg/dL — ABNORMAL HIGH (ref 100–199)
HDL: 128 mg/dL
LDL Chol Calc (NIH): 130 mg/dL — ABNORMAL HIGH (ref 0–99)
Triglycerides: 152 mg/dL — ABNORMAL HIGH (ref 0–149)
VLDL Cholesterol Cal: 25 mg/dL (ref 5–40)

## 2024-10-03 LAB — BASIC METABOLIC PANEL WITH GFR
BUN/Creatinine Ratio: 19 (ref 12–28)
BUN: 14 mg/dL (ref 8–27)
CO2: 23 mmol/L (ref 20–29)
Calcium: 9.2 mg/dL (ref 8.7–10.3)
Chloride: 103 mmol/L (ref 96–106)
Creatinine, Ser: 0.74 mg/dL (ref 0.57–1.00)
Glucose: 92 mg/dL (ref 70–99)
Potassium: 4.4 mmol/L (ref 3.5–5.2)
Sodium: 141 mmol/L (ref 134–144)
eGFR: 90 mL/min/1.73

## 2024-10-03 LAB — TSH: TSH: 1.65 u[IU]/mL (ref 0.450–4.500)

## 2024-10-03 LAB — LIPOPROTEIN A (LPA): Lipoprotein (a): <8.4 nmol/L

## 2024-10-05 ENCOUNTER — Encounter (HOSPITAL_COMMUNITY): Payer: Self-pay

## 2024-10-09 ENCOUNTER — Ambulatory Visit (HOSPITAL_COMMUNITY): Payer: Self-pay

## 2024-10-16 ENCOUNTER — Ambulatory Visit (HOSPITAL_BASED_OUTPATIENT_CLINIC_OR_DEPARTMENT_OTHER): Payer: Self-pay | Admitting: Cardiology

## 2024-10-18 ENCOUNTER — Ambulatory Visit (HOSPITAL_COMMUNITY)
Admission: RE | Admit: 2024-10-18 | Discharge: 2024-10-18 | Disposition: A | Source: Ambulatory Visit | Attending: Cardiovascular Disease | Admitting: Cardiovascular Disease

## 2024-10-18 DIAGNOSIS — R0609 Other forms of dyspnea: Secondary | ICD-10-CM

## 2024-10-18 MED ORDER — IOHEXOL 350 MG/ML SOLN: 100.0000 mL | Freq: Once | INTRAVENOUS | Status: DC | PRN

## 2024-10-18 MED ORDER — NITROGLYCERIN 0.4 MG SL SUBL
0.8000 mg | SUBLINGUAL_TABLET | Freq: Once | SUBLINGUAL | Status: AC
  Administered 2024-10-18: 0.8 mg via SUBLINGUAL

## 2024-10-18 MED ORDER — IOHEXOL 350 MG/ML SOLN
100.0000 mL | Freq: Once | INTRAVENOUS | Status: AC | PRN
  Administered 2024-10-18: 100 mL via INTRAVENOUS
